# Patient Record
Sex: Female | Born: 1960 | Race: White | Hispanic: No | Marital: Married | Smoking: Never smoker
Health system: Southern US, Community
[De-identification: ages and names within clinical notes are randomized; demographics above are authoritative.]

## PROBLEM LIST (undated history)

## (undated) DIAGNOSIS — F329 Major depressive disorder, single episode, unspecified: Secondary | ICD-10-CM

## (undated) DIAGNOSIS — K1379 Other lesions of oral mucosa: Secondary | ICD-10-CM

## (undated) DIAGNOSIS — F419 Anxiety disorder, unspecified: Secondary | ICD-10-CM

## (undated) DIAGNOSIS — M8580 Other specified disorders of bone density and structure, unspecified site: Secondary | ICD-10-CM

## (undated) DIAGNOSIS — G4733 Obstructive sleep apnea (adult) (pediatric): Secondary | ICD-10-CM

## (undated) DIAGNOSIS — E039 Hypothyroidism, unspecified: Secondary | ICD-10-CM

## (undated) HISTORY — DX: Other lesions of oral mucosa: K13.79

## (undated) HISTORY — DX: Major depressive disorder, single episode, unspecified: F32.9

## (undated) HISTORY — DX: Anxiety disorder, unspecified: F41.9

## (undated) HISTORY — PX: LAPAROSCOPIC TOTAL HYSTERECTOMY: SUR800

## (undated) HISTORY — DX: Hypothyroidism, unspecified: E03.9

## (undated) HISTORY — PX: TONSILLECTOMY: SUR1361

## (undated) HISTORY — PX: APPENDECTOMY: SHX54

## (undated) HISTORY — DX: Other specified disorders of bone density and structure, unspecified site: M85.80

## (undated) HISTORY — DX: Obstructive sleep apnea (adult) (pediatric): G47.33

---

## 2015-03-15 LAB — HM DEXA SCAN: HM Dexa Scan: NORMAL

## 2016-11-16 LAB — HM COLONOSCOPY

## 2017-09-06 DIAGNOSIS — M9901 Segmental and somatic dysfunction of cervical region: Secondary | ICD-10-CM | POA: Diagnosis not present

## 2017-09-06 DIAGNOSIS — M9903 Segmental and somatic dysfunction of lumbar region: Secondary | ICD-10-CM | POA: Diagnosis not present

## 2017-09-06 DIAGNOSIS — M62830 Muscle spasm of back: Secondary | ICD-10-CM | POA: Diagnosis not present

## 2017-09-06 DIAGNOSIS — M9902 Segmental and somatic dysfunction of thoracic region: Secondary | ICD-10-CM | POA: Diagnosis not present

## 2017-09-28 DIAGNOSIS — Z713 Dietary counseling and surveillance: Secondary | ICD-10-CM | POA: Diagnosis not present

## 2017-09-30 DIAGNOSIS — J3089 Other allergic rhinitis: Secondary | ICD-10-CM | POA: Diagnosis not present

## 2017-09-30 DIAGNOSIS — Z780 Asymptomatic menopausal state: Secondary | ICD-10-CM | POA: Diagnosis not present

## 2017-09-30 DIAGNOSIS — E039 Hypothyroidism, unspecified: Secondary | ICD-10-CM | POA: Diagnosis not present

## 2017-12-13 ENCOUNTER — Encounter: Payer: Self-pay | Admitting: Family Medicine

## 2017-12-13 ENCOUNTER — Ambulatory Visit: Payer: BLUE CROSS/BLUE SHIELD | Admitting: Family Medicine

## 2017-12-13 VITALS — BP 122/78 | HR 88 | Temp 98.5°F | Ht 65.0 in | Wt 187.2 lb

## 2017-12-13 DIAGNOSIS — E039 Hypothyroidism, unspecified: Secondary | ICD-10-CM | POA: Diagnosis not present

## 2017-12-13 DIAGNOSIS — Z7689 Persons encountering health services in other specified circumstances: Secondary | ICD-10-CM

## 2017-12-13 DIAGNOSIS — F419 Anxiety disorder, unspecified: Secondary | ICD-10-CM | POA: Insufficient documentation

## 2017-12-13 DIAGNOSIS — R0982 Postnasal drip: Secondary | ICD-10-CM

## 2017-12-13 DIAGNOSIS — G4733 Obstructive sleep apnea (adult) (pediatric): Secondary | ICD-10-CM

## 2017-12-13 HISTORY — DX: Anxiety disorder, unspecified: F41.9

## 2017-12-13 NOTE — Progress Notes (Signed)
Subjective:    Patient ID: Amy Barnett, female    DOB: 07-31-1960, 57 y.o.   MRN: [id number]  HPI Chief Complaint  Patient presents with  . other     establish care   She is new to the practice and here to establish care. She moved here 4 months ago from [facility], prior to that she lived in [place]. States her allergies worsened while living in [place] and since moving here in [place] they have not improved.  She continues having postnasal drainage.  States she was originally on Claritin and 3 months ago switch to [employer].  She has tried Singulair in the past and has seen ENT for this.  Denies ever seen an allergist.  Denies fever, chills, dizziness, chest pain, palpitations, shortness of breath, abdominal pain, N/V/D, urinary symptoms, LE edema.    Reports being on Prozac for anxiety hysterectomy in 2012.  She is stable on this medication and does not want to stop.  No issues with depression or anxiety currently.  Hypothyroidism diagnosed after most recent pregnancy.  She has been on levothyroxine since.  Reports having labs checked in June at her employer and her thyroid function was normal.  These records are not available today.  Reports being stable on current dose.  Reports having a sleep study in [facility] 1 and 1/2 years ago and was told she had severe sleep apnea.  States she could not tolerate the CPAP so she has not been treating it.  Denies fatigue, daytime sleepiness. She does not have these records with her.  We will need to get them.  She is in favor of seeing if a nasal cannula or newer CPAP would work for her.  Reports having a colonoscopy this past year in [place].  We will need to get this report.  States it was normal.  States she has friends and fiance here. Works at [employer].   Reviewed allergies, medications, past medical, surgical, family, and social history.    Review of Systems Pertinent positives and negatives in the history of present  illness.     Objective:   Physical Exam BP 122/78 (BP Location: Left Arm, Patient Position: Sitting)   Pulse 88   Temp 98.5 F (36.9 C)   Ht 5\' 5"  (1.651 m)   Wt 187 lb 3.2 oz (84.9 kg)   SpO2 94%   BMI 31.15 kg/m   Alert and oriented and in no distress. No sinus tenderness. Nares patent with erythema, no discharge.  Tympanic membranes and canals are normal. Pharyngeal area with mild erythema, no edema or exudate. Neck is supple without adenopathy or thyromegaly. Cardiac exam shows a regular sinus rhythm without murmurs or gallops. Lungs are clear to auscultation. Extremities without edema. Skin is warm and dry.       Assessment & Plan:  Hypothyroidism (acquired)  Anxiety  Encounter to establish care  Post-nasal drainage  OSA (obstructive sleep apnea)  Recent labs in June with normal TSH per patient. Continue on levothyroxine. Will recheck thyroid function at her upcoming CPE.  Anxiety- well controlled with Prozac. Stable on medication for years.  Post nasal drainage- continue Allegra. Has used Claritin in the past. Has seen ENT in the past. Singulair did not help. Has never seen an allergist. Discussed this would be our next step.  OSA- "severe" per patient. Will get sleep study done in [facility]. She has not been able to tolerate CPAP and is not currently  treating sleep apnea. Discussed referral to [facility] once I have the report to review and share with [facility].  Discussed avoiding using sedatives in the meantime since this can worsen sleep apnea.  Follow up in 1-2 months for a fasting CPE.

## 2017-12-14 ENCOUNTER — Ambulatory Visit: Payer: Self-pay | Admitting: Family Medicine

## 2017-12-14 DIAGNOSIS — Z713 Dietary counseling and surveillance: Secondary | ICD-10-CM | POA: Diagnosis not present

## 2017-12-27 DIAGNOSIS — Z780 Asymptomatic menopausal state: Secondary | ICD-10-CM | POA: Diagnosis not present

## 2017-12-27 DIAGNOSIS — E039 Hypothyroidism, unspecified: Secondary | ICD-10-CM | POA: Diagnosis not present

## 2017-12-27 DIAGNOSIS — J3089 Other allergic rhinitis: Secondary | ICD-10-CM | POA: Diagnosis not present

## 2018-01-12 DIAGNOSIS — H6590 Unspecified nonsuppurative otitis media, unspecified ear: Secondary | ICD-10-CM | POA: Diagnosis not present

## 2018-02-11 ENCOUNTER — Ambulatory Visit (INDEPENDENT_AMBULATORY_CARE_PROVIDER_SITE_OTHER): Payer: BLUE CROSS/BLUE SHIELD | Admitting: Family Medicine

## 2018-02-11 ENCOUNTER — Encounter: Payer: Self-pay | Admitting: Family Medicine

## 2018-02-11 VITALS — BP 120/78 | HR 81 | Ht 65.0 in | Wt 185.4 lb

## 2018-02-11 DIAGNOSIS — E039 Hypothyroidism, unspecified: Secondary | ICD-10-CM | POA: Diagnosis not present

## 2018-02-11 DIAGNOSIS — Z0000 Encounter for general adult medical examination without abnormal findings: Secondary | ICD-10-CM | POA: Diagnosis not present

## 2018-02-11 DIAGNOSIS — Z1159 Encounter for screening for other viral diseases: Secondary | ICD-10-CM | POA: Diagnosis not present

## 2018-02-11 DIAGNOSIS — F419 Anxiety disorder, unspecified: Secondary | ICD-10-CM | POA: Diagnosis not present

## 2018-02-11 DIAGNOSIS — E2839 Other primary ovarian failure: Secondary | ICD-10-CM | POA: Insufficient documentation

## 2018-02-11 DIAGNOSIS — E559 Vitamin D deficiency, unspecified: Secondary | ICD-10-CM | POA: Diagnosis not present

## 2018-02-11 DIAGNOSIS — G4733 Obstructive sleep apnea (adult) (pediatric): Secondary | ICD-10-CM

## 2018-02-11 DIAGNOSIS — Z1239 Encounter for other screening for malignant neoplasm of breast: Secondary | ICD-10-CM

## 2018-02-11 DIAGNOSIS — Z7185 Encounter for immunization safety counseling: Secondary | ICD-10-CM

## 2018-02-11 DIAGNOSIS — Z90710 Acquired absence of both cervix and uterus: Secondary | ICD-10-CM | POA: Insufficient documentation

## 2018-02-11 DIAGNOSIS — Z23 Encounter for immunization: Secondary | ICD-10-CM | POA: Diagnosis not present

## 2018-02-11 DIAGNOSIS — Z7189 Other specified counseling: Secondary | ICD-10-CM

## 2018-02-11 LAB — POCT URINALYSIS DIP (PROADVANTAGE DEVICE)
BILIRUBIN UA: NEGATIVE
GLUCOSE UA: NEGATIVE mg/dL
Ketones, POC UA: NEGATIVE mg/dL
Leukocytes, UA: NEGATIVE
Nitrite, UA: NEGATIVE
Protein Ur, POC: NEGATIVE mg/dL
RBC UA: NEGATIVE
Specific Gravity, Urine: 1.01
Urobilinogen, Ur: NEGATIVE
pH, UA: 6 (ref 5.0–8.0)

## 2018-02-11 MED ORDER — FLUOXETINE HCL 20 MG PO CAPS: 20.0000 mg | ORAL_CAPSULE | Freq: Every day | ORAL | 5 refills | Status: DC

## 2018-02-11 NOTE — Patient Instructions (Signed)
You can call and schedule a nurse visit to get the Shingrix vaccine. This is a 2 shot series.  You can check with your insurance company to see if this is affordable.   Call and schedule your mammogram and bone density.   We will call you with your lab results.    Preventive Care 40-64 Years, Female Preventive care refers to lifestyle choices and visits with your health care provider that can promote health and wellness. What does preventive care include?   A yearly physical exam. This is also called an annual well check.  Dental exams once or twice a year.  Routine eye exams. Ask your health care provider how often you should have your eyes checked.  Personal lifestyle choices, including: ? Daily care of your teeth and gums. ? Regular physical activity. ? Eating a healthy diet. ? Avoiding tobacco and drug use. ? Limiting alcohol use. ? Practicing safe sex. ? Taking low-dose aspirin daily starting at age 61. ? Taking vitamin and mineral supplements as recommended by your health care provider. What happens during an annual well check? The services and screenings done by your health care provider during your annual well check will depend on your age, overall health, lifestyle risk factors, and family history of disease. Counseling Your health care provider may ask you questions about your:  Alcohol use.  Tobacco use.  Drug use.  Emotional well-being.  Home and relationship well-being.  Sexual activity.  Eating habits.  Work and work Statistician.  Method of birth control.  Menstrual cycle.  Pregnancy history. Screening You may have the following tests or measurements:  Height, weight, and BMI.  Blood pressure.  Lipid and cholesterol levels. These may be checked every 5 years, or more frequently if you are over 27 years old.  Skin check.  Lung cancer screening. You may have this screening every year starting at age 19 if you have a 30-pack-year history of  smoking and currently smoke or have quit within the past 15 years.  Colorectal cancer screening. All adults should have this screening starting at age 2 and continuing until age 6. Your health care provider may recommend screening at age 25. You will have tests every 1-10 years, depending on your results and the type of screening test. People at increased risk should start screening at an earlier age. Screening tests may include: ? Guaiac-based fecal occult blood testing. ? Fecal immunochemical test (FIT). ? Stool DNA test. ? Virtual colonoscopy. ? Sigmoidoscopy. During this test, a flexible tube with a tiny camera (sigmoidoscope) is used to examine your rectum and lower colon. The sigmoidoscope is inserted through your anus into your rectum and lower colon. ? Colonoscopy. During this test, a long, thin, flexible tube with a tiny camera (colonoscope) is used to examine your entire colon and rectum.  Hepatitis C blood test.  Hepatitis B blood test.  Sexually transmitted disease (STD) testing.  Diabetes screening. This is done by checking your blood sugar (glucose) after you have not eaten for a while (fasting). You may have this done every 1-3 years.  Mammogram. This may be done every 1-2 years. Talk to your health care provider about when you should start having regular mammograms. This may depend on whether you have a family history of breast cancer.  BRCA-related cancer screening. This may be done if you have a family history of breast, ovarian, tubal, or peritoneal cancers.  Pelvic exam and Pap test. This may be done every 3 years starting  at age 33. Starting at age 10, this may be done every 5 years if you have a Pap test in combination with an HPV test.  Bone density scan. This is done to screen for osteoporosis. You may have this scan if you are at high risk for osteoporosis. Discuss your test results, treatment options, and if necessary, the need for more tests with your health care  provider. Vaccines Your health care provider may recommend certain vaccines, such as:  Influenza vaccine. This is recommended every year.  Tetanus, diphtheria, and acellular pertussis (Tdap, Td) vaccine. You may need a Td booster every 10 years.  Varicella vaccine. You may need this if you have not been vaccinated.  Zoster vaccine. You may need this after age 5.  Measles, mumps, and rubella (MMR) vaccine. You may need at least one dose of MMR if you were born in 1957 or later. You may also need a second dose.  Pneumococcal 13-valent conjugate (PCV13) vaccine. You may need this if you have certain conditions and were not previously vaccinated.  Pneumococcal polysaccharide (PPSV23) vaccine. You may need one or two doses if you smoke cigarettes or if you have certain conditions.  Meningococcal vaccine. You may need this if you have certain conditions.  Hepatitis A vaccine. You may need this if you have certain conditions or if you travel or work in places where you may be exposed to hepatitis A.  Hepatitis B vaccine. You may need this if you have certain conditions or if you travel or work in places where you may be exposed to hepatitis B.  Haemophilus influenzae type b (Hib) vaccine. You may need this if you have certain conditions. Talk to your health care provider about which screenings and vaccines you need and how often you need them. This information is not intended to replace advice given to you by your health care provider. Make sure you discuss any questions you have with your health care provider. Document Released: 02/15/2015 Document Revised: 03/11/2017 Document Reviewed: 11/20/2014 Elsevier Interactive Patient Education  2019 [employer].

## 2018-02-11 NOTE — Progress Notes (Signed)
Subjective:    Patient ID: Amy BarrPaula Harthen, female    DOB: 11/22/1960, 58 y.o.   MRN: [id number]  HPI Chief Complaint  Patient presents with  . nonfasting cpe    nonfasting cpe, has a lab in work that will do them- need orders. needs refill on med. sees eye doctor. declines flu shot. does not see obgyn   She is fairly new to the practice and here for a complete physical exam. [facility] [facility]  Other providers: Dermatologist- scheduled for March.   Getting married in May  Vitamin D deficiency- taking mulitivitamin   Social history: Lives with fiance. Has 2 kids in [place]. 4 grand kids.  works for [employer] Denies smoking, drinking alcohol, drug use  Diet: just started on weight watchers  Excerise: nothing. Plans to start  Immunizations: refuse flu. Tdap unknown.   Health maintenance:  Mammogram: 2018 DEXA: 4 years ago  Colonoscopy: 2019 and normal  Last Menstrual cycle: hysterectomy  Last Dental Exam: every 6 months  Last Eye Exam: over a year ago. Wears contacts.   Wears seatbelt always, uses sunscreen, smoke detectors in home and functioning, does not text while driving and feels safe in home environment.   Reviewed allergies, medications, past medical, surgical, family, and social history.    Review of Systems Review of Systems Constitutional: -fever, -chills, -sweats, -unexpected weight change,-fatigue ENT: -runny nose, -ear pain, -sore throat Cardiology:  -chest pain, -palpitations, -edema Respiratory: -cough, -shortness of breath, -wheezing Gastroenterology: -abdominal pain, -nausea, -vomiting, -diarrhea, -constipation  Hematology: -bleeding or bruising problems Musculoskeletal: -arthralgias, -myalgias, -joint swelling, -back pain Ophthalmology: -vision changes Urology: -dysuria, -difficulty urinating, -hematuria, -urinary frequency, -urgency Neurology: -headache, -weakness, -tingling, -numbness       Objective:   Physical Exam BP  120/78   Pulse 81   Ht 5\' 5"  (1.651 m)   Wt 185 lb 6.4 oz (84.1 kg)   BMI 30.85 kg/m   General Appearance:    Alert, cooperative, no distress, appears stated age  Head:    Normocephalic, without obvious abnormality, atraumatic  Eyes:    PERRL, conjunctiva/corneas clear, EOM's intact, fundi    benign  Ears:    Normal TM's and external ear canals  Nose:   Nares normal, mucosa normal, no drainage or sinus   tenderness  Throat:   Lips, mucosa, and tongue normal; teeth and gums normal  Neck:   Supple, no lymphadenopathy;  thyroid:  no   enlargement/tenderness/nodules; no carotid   bruit or JVD  Back:    Spine nontender, no curvature, ROM normal, no CVA     tenderness  Lungs:     Clear to auscultation bilaterally without wheezes, rales or     ronchi; respirations unlabored  Chest Wall:    No tenderness or deformity   Heart:    Regular rate and rhythm, S1 and S2 normal, no murmur, rub   or gallop  Breast Exam:    Declines. Mammogram ordered   Abdomen:     Soft, non-tender, nondistended, normoactive bowel sounds,    no masses, no hepatosplenomegaly  Genitalia:    Declines. Hysterectomy      Extremities:   No clubbing, cyanosis or edema  Pulses:   2+ and symmetric all extremities  Skin:   Skin color, texture, turgor normal, no rashes or lesions  Lymph nodes:   Cervical, supraclavicular, and axillary nodes normal  Neurologic:   CNII-XII intact, normal strength, sensation and gait; reflexes 2+ and symmetric  throughout          Psych:   Normal mood, affect, hygiene and grooming.    Urinalysis dipstick: negative       Assessment & Plan:  Routine general medical examination at a health care facility - Plan: CBC with Differential/Platelet, Comprehensive metabolic panel, POCT Urinalysis DIP (Proadvantage Device)  Hypothyroidism (acquired) - Plan: TSH, T4, free  Anxiety  OSA (obstructive sleep apnea)  Screening for breast cancer - Plan: MM DIGITAL SCREENING BILATERAL  Vitamin D  deficiency - Plan: VITAMIN D 25 Hydroxy (Vit-D Deficiency, Fractures)  Estrogen deficiency - Plan: DG Bone Density  H/O total hysterectomy - Plan: DG Bone Density  Need for Tdap vaccination - Plan: Tdap vaccine greater than or equal to 7yo IM  Immunization counseling  Need for hepatitis C screening test - Plan: Hepatitis C antibody  She is still fairly new to me and here today for her CPE.  She is not fasting and requests a prescription to get her lipids checked through her employer on a different day when she is fasting.  A prescription was given to her for this. History of hypothyroidism and reports being stable on her current dose of levothyroxine.  Check thyroid panel and refill as appropriate. Anxiety is well controlled on Prozac.  She will continue on this. History of vitamin D deficiency and is currently taking a multivitamin.  Check vitamin D level She denies having hepatitis C screening in the past.  We will test for hep C today. She will call and schedule her mammogram and bone density test. I am still waiting on medical records from her previous PCP and colonoscopy report from [facility]. Immunization counseling.  She declines flu shot.  Tdap was given and counseling on all components of this vaccine was done.  We also discussed Shingrix vaccine.  She will hold off on this for now but she may call and schedule a nurse visit to get this at her convenience. Once I receive medical records including her sleep study, I will refer her to [facility] to get set up with a CPAP.  Until then she is aware of potential health consequences related to untreated sleep apnea.  We discussed avoiding sedatives and alcohol. Follow-up pending labs

## 2018-02-12 LAB — CBC WITH DIFFERENTIAL/PLATELET
Basophils Absolute: 0.1 10*3/uL (ref 0.0–0.2)
Basos: 1 %
EOS (ABSOLUTE): 0.1 10*3/uL (ref 0.0–0.4)
EOS: 2 %
HEMATOCRIT: 42.8 % (ref 34.0–46.6)
HEMOGLOBIN: 14.2 g/dL (ref 11.1–15.9)
IMMATURE GRANS (ABS): 0 10*3/uL (ref 0.0–0.1)
IMMATURE GRANULOCYTES: 0 %
Lymphocytes Absolute: 2.9 10*3/uL (ref 0.7–3.1)
Lymphs: 37 %
MCH: 30 pg (ref 26.6–33.0)
MCHC: 33.2 g/dL (ref 31.5–35.7)
MCV: 90 fL (ref 79–97)
MONOCYTES: 7 %
Monocytes Absolute: 0.5 10*3/uL (ref 0.1–0.9)
Neutrophils Absolute: 4.2 10*3/uL (ref 1.4–7.0)
Neutrophils: 53 %
PLATELETS: 273 10*3/uL (ref 150–450)
RBC: 4.74 x10E6/uL (ref 3.77–5.28)
RDW: 12.1 % (ref 11.7–15.4)
WBC: 7.8 10*3/uL (ref 3.4–10.8)

## 2018-02-12 LAB — COMPREHENSIVE METABOLIC PANEL
A/G RATIO: 2.2 (ref 1.2–2.2)
ALBUMIN: 5 g/dL (ref 3.5–5.5)
ALT: 23 IU/L (ref 0–32)
AST: 19 IU/L (ref 0–40)
Alkaline Phosphatase: 92 IU/L (ref 39–117)
BUN/Creatinine Ratio: 11 (ref 9–23)
BUN: 9 mg/dL (ref 6–24)
Bilirubin Total: 0.5 mg/dL (ref 0.0–1.2)
CALCIUM: 9.8 mg/dL (ref 8.7–10.2)
CO2: 25 mmol/L (ref 20–29)
Chloride: 98 mmol/L (ref 96–106)
Creatinine, Ser: 0.84 mg/dL (ref 0.57–1.00)
GFR, EST AFRICAN AMERICAN: 90 mL/min/{1.73_m2} (ref >59)
GFR, EST NON AFRICAN AMERICAN: 78 mL/min/{1.73_m2} (ref >59)
GLOBULIN, TOTAL: 2.3 g/dL (ref 1.5–4.5)
Glucose: 102 mg/dL — ABNORMAL HIGH (ref 65–99)
POTASSIUM: 4.4 mmol/L (ref 3.5–5.2)
Sodium: 140 mmol/L (ref 134–144)
TOTAL PROTEIN: 7.3 g/dL (ref 6.0–8.5)

## 2018-02-12 LAB — HEPATITIS C ANTIBODY: Hep C Virus Ab: 0.1 s/co ratio (ref 0.0–0.9)

## 2018-02-12 LAB — VITAMIN D 25 HYDROXY (VIT D DEFICIENCY, FRACTURES): Vit D, 25-Hydroxy: 24.8 ng/mL — ABNORMAL LOW (ref 30.0–100.0)

## 2018-02-12 LAB — TSH: TSH: 2.96 u[IU]/mL (ref 0.450–4.500)

## 2018-02-12 LAB — T4, FREE: FREE T4: 1.46 ng/dL (ref 0.82–1.77)

## 2018-02-15 DIAGNOSIS — Z713 Dietary counseling and surveillance: Secondary | ICD-10-CM | POA: Diagnosis not present

## 2018-02-24 DIAGNOSIS — Z0000 Encounter for general adult medical examination without abnormal findings: Secondary | ICD-10-CM | POA: Diagnosis not present

## 2018-03-01 ENCOUNTER — Telehealth: Payer: Self-pay | Admitting: Internal Medicine

## 2018-03-01 NOTE — Telephone Encounter (Signed)
Pt was notified of cholesterol results

## 2018-03-01 NOTE — Telephone Encounter (Signed)
Left message for pt to call me back    Per vickie, Cholesterol is ok, LDL mildly elevated at 107, like it to be < 100. Eat low fat, low cholesterol diet.

## 2018-03-02 ENCOUNTER — Encounter: Payer: Self-pay | Admitting: Family Medicine

## 2018-03-08 DIAGNOSIS — M546 Pain in thoracic spine: Secondary | ICD-10-CM | POA: Diagnosis not present

## 2018-03-08 DIAGNOSIS — M5412 Radiculopathy, cervical region: Secondary | ICD-10-CM | POA: Diagnosis not present

## 2018-03-08 DIAGNOSIS — M7912 Myalgia of auxiliary muscles, head and neck: Secondary | ICD-10-CM | POA: Diagnosis not present

## 2018-03-08 DIAGNOSIS — M62830 Muscle spasm of back: Secondary | ICD-10-CM | POA: Diagnosis not present

## 2018-03-11 DIAGNOSIS — M7912 Myalgia of auxiliary muscles, head and neck: Secondary | ICD-10-CM | POA: Diagnosis not present

## 2018-03-11 DIAGNOSIS — M62830 Muscle spasm of back: Secondary | ICD-10-CM | POA: Diagnosis not present

## 2018-03-11 DIAGNOSIS — M546 Pain in thoracic spine: Secondary | ICD-10-CM | POA: Diagnosis not present

## 2018-03-11 DIAGNOSIS — M5412 Radiculopathy, cervical region: Secondary | ICD-10-CM | POA: Diagnosis not present

## 2018-03-17 DIAGNOSIS — M7912 Myalgia of auxiliary muscles, head and neck: Secondary | ICD-10-CM | POA: Diagnosis not present

## 2018-03-17 DIAGNOSIS — M5412 Radiculopathy, cervical region: Secondary | ICD-10-CM | POA: Diagnosis not present

## 2018-03-17 DIAGNOSIS — M62830 Muscle spasm of back: Secondary | ICD-10-CM | POA: Diagnosis not present

## 2018-03-17 DIAGNOSIS — M546 Pain in thoracic spine: Secondary | ICD-10-CM | POA: Diagnosis not present

## 2018-03-28 ENCOUNTER — Telehealth: Payer: Self-pay | Admitting: Family Medicine

## 2018-03-28 MED ORDER — LEVOTHYROXINE SODIUM 150 MCG PO TABS: 150.0000 ug | ORAL_TABLET | Freq: Every day | ORAL | 5 refills | Status: DC

## 2018-03-28 NOTE — Telephone Encounter (Signed)
I have sent in med to pharmacy

## 2018-03-28 NOTE — Telephone Encounter (Signed)
Please refill this for her. Labs were normal last month.

## 2018-03-28 NOTE — Telephone Encounter (Signed)
Pt left message she needs levothyroxine to [facility]

## 2018-04-04 ENCOUNTER — Ambulatory Visit
Admission: RE | Admit: 2018-04-04 | Discharge: 2018-04-04 | Disposition: A | Discharge status: Home / Self Care | Payer: BLUE CROSS/BLUE SHIELD | Source: Ambulatory Visit | Attending: Family Medicine | Admitting: Family Medicine

## 2018-04-04 DIAGNOSIS — Z780 Asymptomatic menopausal state: Secondary | ICD-10-CM | POA: Diagnosis not present

## 2018-04-04 DIAGNOSIS — M85852 Other specified disorders of bone density and structure, left thigh: Secondary | ICD-10-CM | POA: Diagnosis not present

## 2018-04-04 DIAGNOSIS — Z1231 Encounter for screening mammogram for malignant neoplasm of breast: Secondary | ICD-10-CM | POA: Diagnosis not present

## 2018-04-04 DIAGNOSIS — E2839 Other primary ovarian failure: Secondary | ICD-10-CM

## 2018-04-04 DIAGNOSIS — Z90710 Acquired absence of both cervix and uterus: Secondary | ICD-10-CM

## 2018-04-04 DIAGNOSIS — Z1239 Encounter for other screening for malignant neoplasm of breast: Secondary | ICD-10-CM

## 2018-04-05 ENCOUNTER — Encounter: Payer: Self-pay | Admitting: Family Medicine

## 2018-04-05 DIAGNOSIS — M8580 Other specified disorders of bone density and structure, unspecified site: Secondary | ICD-10-CM

## 2018-04-05 HISTORY — DX: Other specified disorders of bone density and structure, unspecified site: M85.80

## 2018-04-25 DIAGNOSIS — L578 Other skin changes due to chronic exposure to nonionizing radiation: Secondary | ICD-10-CM | POA: Diagnosis not present

## 2018-04-25 DIAGNOSIS — Z1283 Encounter for screening for malignant neoplasm of skin: Secondary | ICD-10-CM | POA: Diagnosis not present

## 2018-04-25 DIAGNOSIS — D485 Neoplasm of uncertain behavior of skin: Secondary | ICD-10-CM | POA: Diagnosis not present

## 2018-04-25 DIAGNOSIS — L570 Actinic keratosis: Secondary | ICD-10-CM | POA: Diagnosis not present

## 2018-06-03 ENCOUNTER — Telehealth: Payer: Self-pay | Admitting: Family Medicine

## 2018-06-03 NOTE — Telephone Encounter (Signed)
Received requested records from [facility]. Back for review.

## 2018-06-20 ENCOUNTER — Encounter: Payer: Self-pay | Admitting: Family Medicine

## 2018-06-20 DIAGNOSIS — F329 Major depressive disorder, single episode, unspecified: Secondary | ICD-10-CM

## 2018-06-20 HISTORY — DX: Major depressive disorder, single episode, unspecified: F32.9

## 2018-06-22 ENCOUNTER — Encounter: Payer: Self-pay | Admitting: Family Medicine

## 2018-08-18 ENCOUNTER — Other Ambulatory Visit: Payer: Self-pay

## 2018-08-18 ENCOUNTER — Encounter: Payer: Self-pay | Admitting: Medical

## 2018-08-18 ENCOUNTER — Ambulatory Visit: Payer: BC Managed Care – PPO | Admitting: Medical

## 2018-08-18 VITALS — BP 120/82 | HR 83 | Temp 98.6°F | Ht 64.0 in | Wt 187.6 lb

## 2018-08-18 DIAGNOSIS — M7712 Lateral epicondylitis, left elbow: Secondary | ICD-10-CM | POA: Insufficient documentation

## 2018-08-18 DIAGNOSIS — M779 Enthesopathy, unspecified: Secondary | ICD-10-CM

## 2018-08-18 DIAGNOSIS — M778 Other enthesopathies, not elsewhere classified: Secondary | ICD-10-CM

## 2018-08-18 NOTE — Patient Instructions (Addendum)
Your symptoms and exam findings suggests thumb tendonitis and left tennis elbow  Recommendations:  Use REST as much as possible for the next 2 weeks  Use the thumb spice splint daily   Use arm sling over the counter when possible for 1-2 hours at a time  Use a combination of ice therapy for 20 minutes on 20 minutes off alternating with heat therapy 20 minutes on 20 minutes off  You can use Aleve over the counter 2 tablets twice daily for 7-10 days  Talk to your computer IT department about voice activated  software as a possibility to try to limit use of the left hand      De Quervain's Tenosynovitis  De Quervain's tenosynovitis is a condition that causes inflammation of the tendon on the thumb side of the wrist. Tendons are cords of tissue that connect bones to muscles. The tendons in the hand pass through a tunnel called a sheath. A slippery layer of tissue (synovium) lets the tendons move smoothly in the sheath. With de Quervain's tenosynovitis, the sheath swells or thickens, causing friction and pain. The condition is also called de Quervain's disease and de Quervain's syndrome. It occurs most often in women who are 2330-57 years old. What are the causes? The exact cause of this condition is not known. It may be associated with overuse of the hand and wrist. What increases the risk? You are more likely to develop this condition if you:  Use your hands far more than normal, especially if you repeat certain movements that involve twisting your hand or using a tight grip.  Are pregnant.  Are a middle-aged woman.  Have rheumatoid arthritis.  Have diabetes. What are the signs or symptoms? The main symptom of this condition is pain on the thumb side of the wrist. The pain may get worse when you grasp something or turn your wrist. Other symptoms may include:  Pain that extends up the forearm.  Swelling of your wrist and hand.  Trouble moving the thumb and wrist.  A sensation  of snapping in the wrist.  A bump filled with fluid (cyst) in the area of the pain. How is this diagnosed? This condition may be diagnosed based on:  Your symptoms and medical history.  A physical exam. During the exam, your health care provider may do a simple test Lourena Simmonds(Finkelstein test) that involves pulling your thumb and wrist to see if this causes pain. You may also need to have an X-ray. How is this treated? Treatment for this condition may include:  Avoiding any activity that causes pain and swelling.  Taking medicines. Anti-inflammatory medicines and corticosteroid injections may be used to reduce inflammation and relieve pain.  Wearing a splint.  Having surgery. This may be needed if other treatments do not work. Once the pain and swelling has gone down:  Physical therapy. This includes stretching and strengthening exercises.  Occupational therapy. This includes adjusting how you move your wrist. Follow these instructions at home: If you have a splint:  Wear the splint as told by your health care provider. Remove it only as told by your health care provider.  Loosen the splint if your fingers tingle, become numb, or turn cold and blue.  Keep the splint clean.  If the splint is not waterproof: ? Do not let it get wet. ? Cover it with a watertight covering when you take a bath or a shower. Managing pain, stiffness, and swelling   Avoid movements and activities that cause  pain and swelling in the wrist area.  If directed, put ice on the painful area. This may be helpful after doing activities that involve the sore wrist. ? Put ice in a plastic bag. ? Place a towel between your skin and the bag. ? Leave the ice on for 20 minutes, 2-3 times a day.  Move your fingers often to avoid stiffness and to lessen swelling.  Raise (elevate) the injured area above the level of your heart while you are sitting or lying down. General instructions  Return to your normal  activities as told by your health care provider. Ask your health care provider what activities are safe for you.  Take over-the-counter and prescription medicines only as told by your health care provider.  Keep all follow-up visits as told by your health care provider. This is important. Contact a health care provider if:  Your pain medicine does not help.  Your pain gets worse.  You develop new symptoms. Summary  De Quervain's tenosynovitis is a condition that causes inflammation of the tendon on the thumb side of the wrist.  The condition occurs most often in women who are 37-17 years old.  The exact cause of this condition is not known. It may be associated with overuse of the hand and wrist.  Treatment starts with avoiding activity that causes pain or swelling in the wrist area. Other treatment may include wearing a splint and taking medicine. Sometimes, surgery is needed. This information is not intended to replace advice given to you by your health care provider. Make sure you discuss any questions you have with your health care provider. Document Released: 10/14/2000 Document Revised: 07/22/2017 Document Reviewed: 12/28/2016 Elsevier Patient Education  2020 [employer].

## 2018-08-18 NOTE — Progress Notes (Signed)
Subjective: Chief Complaint  Patient presents with  . Hand Pain    left thumb pain and numbness    Here for left thumb pain and numbness that started maybe a month ago.  Wonders about carpal tunnel syndrome.    Been having throbbing for about a month.  Worse this past weekend .  Yesterday couldn't barely adduct the thumb.  Works as a Industrial/product designermortgage closer, on the computer all day.   Is right handed.   No recent injury, no traumas.   She does feel some swelling in the in the meaty part of the thumb and hand.  Sometimes other fingers seem swollen.  She also notes pain including forearm and up to elbow related to the thumb.   No other joint pains.   She is a nonsmoker.  Has had similar problem with right hand 25 years ago.   Just started using a splint she bought OTC.  Using Aleve OTC once daily for past weekend.  Using no ice.  No numbness or tingling, just pain, including sharp pain.  No other aggravating or relieving factors. No other complaint.   Past Medical History:  Diagnosis Date  . Anxiety 12/13/2017  . Hypothyroidism (acquired)   . MDD (major depressive disorder) 06/20/2018  . OSA (obstructive sleep apnea)   . Osteopenia determined by x-ray 04/05/2018  . Uvular swelling    2017 and 2015   Current Outpatient Medications on File Prior to Visit  Medication Sig Dispense Refill  . fexofenadine (ALLEGRA) 180 MG tablet Take 180 mg by mouth daily.    Marland Kitchen. FLUoxetine (PROZAC) 20 MG capsule Take 1 capsule (20 mg total) by mouth daily. 30 capsule 5  . levothyroxine (SYNTHROID, LEVOTHROID) 150 MCG tablet Take 1 tablet (150 mcg total) by mouth daily before breakfast. 30 tablet 5  . acetaminophen (TYLENOL) 500 MG tablet Take 500 mg by mouth every 6 (six) hours as needed.     No current facility-administered medications on file prior to visit.    ROS as in subjective    Objective: BP 120/82   Pulse 83   Temp 98.6 F (37 C) (Oral)   Ht 5\' 4"  (1.626 m)   Wt 187 lb 9.6 oz (85.1 kg)   SpO2 98%   BMI  32.20 kg/m   Gen: wd, wn, nad, white female Skin: warm, no erythema or bruising Quite tender over left thumb base and in between thumb and second finger, pain with some range of motion with abduction and Adduction of thumb both passive and active and resisted, tender mildly in left forearm and over the lateral epicondyle, otherwise mild tenderness in left wrist, rest of left arm and fingers nontender.  There is slight puffiness over the left thumb base and left hand at the base of the thumb.  No other deformity no other swelling.  Range of motion of fingers is normal except for the thumb which is reduced with abduction and adduction Arms neurovascularly intact    Assessment: Encounter Diagnoses  Name Primary?  . Thumb tendonitis Yes  . Lateral epicondylitis of left elbow      Plan: Your symptoms and exam findings suggests thumb tendonitis and left tennis elbow  Recommendations:  Use REST as much as possible for the next 2 weeks  Use the thumb spice splint daily   Use arm sling over the counter when possible for 1-2 hours at a time  Use a combination of ice therapy for 20 minutes on 20 minutes off alternating  with heat therapy 20 minutes on 20 minutes off  You can use Aleve over the counter 2 tablets twice daily for 7-10 days  Talk to your computer IT department about voice activated  software as a possibility to try to limit use of the left hand

## 2018-09-22 ENCOUNTER — Other Ambulatory Visit: Payer: Self-pay | Admitting: Family Medicine

## 2018-11-25 ENCOUNTER — Ambulatory Visit (INDEPENDENT_AMBULATORY_CARE_PROVIDER_SITE_OTHER): Payer: BC Managed Care – PPO | Admitting: Family Medicine

## 2018-11-25 ENCOUNTER — Encounter: Payer: Self-pay | Admitting: Family Medicine

## 2018-11-25 ENCOUNTER — Other Ambulatory Visit: Payer: Self-pay

## 2018-11-25 VITALS — BP 132/76 | HR 77 | Temp 97.8°F | Wt 189.4 lb

## 2018-11-25 DIAGNOSIS — G4733 Obstructive sleep apnea (adult) (pediatric): Secondary | ICD-10-CM

## 2018-11-25 DIAGNOSIS — R0982 Postnasal drip: Secondary | ICD-10-CM

## 2018-11-25 DIAGNOSIS — F419 Anxiety disorder, unspecified: Secondary | ICD-10-CM | POA: Diagnosis not present

## 2018-11-25 DIAGNOSIS — E039 Hypothyroidism, unspecified: Secondary | ICD-10-CM

## 2018-11-25 DIAGNOSIS — E559 Vitamin D deficiency, unspecified: Secondary | ICD-10-CM

## 2018-11-25 DIAGNOSIS — Z13220 Encounter for screening for lipoid disorders: Secondary | ICD-10-CM | POA: Diagnosis not present

## 2018-11-25 DIAGNOSIS — Z8639 Personal history of other endocrine, nutritional and metabolic disease: Secondary | ICD-10-CM

## 2018-11-25 NOTE — Patient Instructions (Signed)
Try taking Mucinex over the counter and increasing water intake to 8 glasses.

## 2018-11-25 NOTE — Progress Notes (Signed)
   Subjective:    Patient ID: Amy Barnett, female    DOB: 08-04-1960, 58 y.o.   MRN: [id number]  HPI Chief Complaint  Patient presents with  . med check    med check, declines flu shot   She is here today for a medication management visit. History of hypothyroidism and has been stable on current dose of levothyroxine for years per patient.  History of vitamin D deficiency and states she has been taking vitamin D 6,000 IUs daily.   Reports a history of elevated triglycerides and no recent lipid panel.  Needs this checked today.  She is fasting.  Anxiety and depression-states she is taking Prozac and has been doing well on this for years.  States she mainly has anxiety issues and does not feel depressed.  Post nasal drainage-she has tried antihistamines without any relief.  This has been an issue for her since moving from [facility].  OSA-reports having a sleep study in [facility] approximately 3 years ago and was told she has severe sleep apnea.  States she could not tolerate the CPAP so she has not been treating her sleep apnea.  States she often wakes up gagging and her husband tells her she snores.  I was unable to get her sleep study report after trying on several occasions.  She is interested in having another sleep study and seeing if she can tolerate a mask now.  No other concerns today.  Never smoker.   Denies fever, chills, dizziness, chest pain, palpitations, shortness of breath, abdominal pain, N/V/D, urinary symptoms, LE edema.     Review of Systems Pertinent positives and negatives in the history of present illness.     Objective:   Physical Exam BP 132/76   Pulse 77   Temp 97.8 F (36.6 C)   Wt 189 lb 6.4 oz (85.9 kg)   BMI 32.51 kg/m   Alert and in no distress. Neck is supple without adenopathy or thyromegaly. Cardiac exam shows a regular sinus rhythm without murmurs or gallops. Lungs are clear to auscultation. Extremities without edema. Skin is warm and dry.       Assessment & Plan:  Hypothyroidism (acquired) - Plan: CBC with Differential/Platelet, Comprehensive metabolic panel, TSH, T4, free -She has been stable on levothyroxine for years.  Check thyroid panel adjust medication as appropriate.  Vitamin D deficiency - Plan: VITAMIN D 25 Hydroxy (Vit-D Deficiency, Fractures) -She seems to be taking a high dose of daily vitamin D.  Her vitamin D level has not been checked recently.  Follow-up pending results  Anxiety - Plan: CBC with Differential/Platelet, Comprehensive metabolic panel, TSH, T4, free -Appears to be doing well on Prozac and no new concerns with her mood.  Post-nasal drainage-she has tried an antihistamine in the past without any success.  Encouraged her to increase her water intake and try Mucinex  OSA (obstructive sleep apnea) - Plan: Home sleep test -She reports a history of severe sleep apnea and this has been untreated.  I recommend that she have a home sleep study and I will order this today.  Discussed potential long-term health consequences associated with untreated sleep apnea.  She is willing to do this and consider wearing CPAP.  History of endogenous hypertriglyceridemia - Plan: Lipid panel -Check lipid panel and follow-up.  Medicate as appropriate

## 2018-11-26 LAB — CBC WITH DIFFERENTIAL/PLATELET
Basophils Absolute: 0 10*3/uL (ref 0.0–0.2)
Basos: 1 %
EOS (ABSOLUTE): 0.1 10*3/uL (ref 0.0–0.4)
Eos: 1 %
Hematocrit: 43.5 % (ref 34.0–46.6)
Hemoglobin: 14.4 g/dL (ref 11.1–15.9)
Immature Grans (Abs): 0 10*3/uL (ref 0.0–0.1)
Immature Granulocytes: 0 %
Lymphocytes Absolute: 2.6 10*3/uL (ref 0.7–3.1)
Lymphs: 34 %
MCH: 29.6 pg (ref 26.6–33.0)
MCHC: 33.1 g/dL (ref 31.5–35.7)
MCV: 89 fL (ref 79–97)
Monocytes Absolute: 0.5 10*3/uL (ref 0.1–0.9)
Monocytes: 7 %
Neutrophils Absolute: 4.5 10*3/uL (ref 1.4–7.0)
Neutrophils: 57 %
Platelets: 281 10*3/uL (ref 150–450)
RBC: 4.87 x10E6/uL (ref 3.77–5.28)
RDW: 12.7 % (ref 11.7–15.4)
WBC: 7.7 10*3/uL (ref 3.4–10.8)

## 2018-11-26 LAB — LIPID PANEL
Chol/HDL Ratio: 3.6 ratio (ref 0.0–4.4)
Cholesterol, Total: 194 mg/dL (ref 100–199)
HDL: 54 mg/dL (ref >39)
LDL Chol Calc (NIH): 105 mg/dL — ABNORMAL HIGH (ref 0–99)
Triglycerides: 206 mg/dL — ABNORMAL HIGH (ref 0–149)
VLDL Cholesterol Cal: 35 mg/dL (ref 5–40)

## 2018-11-26 LAB — COMPREHENSIVE METABOLIC PANEL
ALT: 15 IU/L (ref 0–32)
AST: 18 IU/L (ref 0–40)
Albumin/Globulin Ratio: 1.8 (ref 1.2–2.2)
Albumin: 4.8 g/dL (ref 3.8–4.9)
Alkaline Phosphatase: 96 IU/L (ref 39–117)
BUN/Creatinine Ratio: 15 (ref 9–23)
BUN: 11 mg/dL (ref 6–24)
Bilirubin Total: 0.5 mg/dL (ref 0.0–1.2)
CO2: 25 mmol/L (ref 20–29)
Calcium: 10.2 mg/dL (ref 8.7–10.2)
Chloride: 98 mmol/L (ref 96–106)
Creatinine, Ser: 0.72 mg/dL (ref 0.57–1.00)
GFR calc Af Amer: 108 mL/min/{1.73_m2} (ref >59)
GFR calc non Af Amer: 93 mL/min/{1.73_m2} (ref >59)
Globulin, Total: 2.7 g/dL (ref 1.5–4.5)
Glucose: 87 mg/dL (ref 65–99)
Potassium: 4.5 mmol/L (ref 3.5–5.2)
Sodium: 139 mmol/L (ref 134–144)
Total Protein: 7.5 g/dL (ref 6.0–8.5)

## 2018-11-26 LAB — TSH: TSH: 4.15 u[IU]/mL (ref 0.450–4.500)

## 2018-11-26 LAB — VITAMIN D 25 HYDROXY (VIT D DEFICIENCY, FRACTURES): Vit D, 25-Hydroxy: 82.5 ng/mL (ref 30.0–100.0)

## 2018-11-26 LAB — T4, FREE: Free T4: 1.27 ng/dL (ref 0.82–1.77)

## 2018-11-29 ENCOUNTER — Other Ambulatory Visit: Payer: Self-pay | Admitting: Internal Medicine

## 2018-11-29 MED ORDER — LEVOTHYROXINE SODIUM 150 MCG PO TABS: 150.0000 ug | ORAL_TABLET | Freq: Every day | ORAL | 5 refills | Status: DC

## 2018-12-23 ENCOUNTER — Other Ambulatory Visit: Payer: Self-pay | Admitting: Family Medicine

## 2018-12-23 NOTE — Telephone Encounter (Signed)
Pt informed

## 2018-12-23 NOTE — Telephone Encounter (Signed)
Pt left message saying she needs this refilled

## 2019-01-14 ENCOUNTER — Other Ambulatory Visit: Payer: Self-pay | Admitting: Family Medicine

## 2019-02-20 ENCOUNTER — Ambulatory Visit (HOSPITAL_BASED_OUTPATIENT_CLINIC_OR_DEPARTMENT_OTHER): Payer: BLUE CROSS/BLUE SHIELD | Admitting: Internal Medicine

## 2019-02-27 ENCOUNTER — Ambulatory Visit (HOSPITAL_BASED_OUTPATIENT_CLINIC_OR_DEPARTMENT_OTHER): Payer: Self-pay | Attending: Family Medicine | Admitting: Internal Medicine

## 2019-02-27 ENCOUNTER — Other Ambulatory Visit: Payer: Self-pay

## 2019-02-27 DIAGNOSIS — G4733 Obstructive sleep apnea (adult) (pediatric): Secondary | ICD-10-CM | POA: Insufficient documentation

## 2019-02-27 DIAGNOSIS — R0683 Snoring: Secondary | ICD-10-CM | POA: Insufficient documentation

## 2019-03-03 DIAGNOSIS — Z0279 Encounter for issue of other medical certificate: Secondary | ICD-10-CM

## 2019-03-04 DIAGNOSIS — G4733 Obstructive sleep apnea (adult) (pediatric): Secondary | ICD-10-CM

## 2019-03-04 NOTE — Procedures (Signed)
    Patient Name: Amy Barnett, Amy Barnett Date: 02/27/2019 Gender: Female D.O.B: Jan 08, 1961 Age (years): 58 Referring Provider: Avanell Shackleton NP Height (inches): 64 Interpreting Physician: Jetty Duhamel MD, ABSM Weight (lbs): 180 RPSGT: [place] BMI: 31 MRN: [id number] Neck Size: 14.00  CLINICAL INFORMATION Sleep Study Type: HST Indication for sleep study: OSA Epworth Sleepiness Score: 4  SLEEP STUDY TECHNIQUE A multi-channel overnight portable sleep study was performed. The channels recorded were: nasal airflow, thoracic respiratory movement, and oxygen saturation with a pulse oximetry. Snoring was also monitored.  MEDICATIONS Patient self administered medications include: none reported.  SLEEP ARCHITECTURE Patient was studied for 369.5 minutes. The sleep efficiency was 99.6 % and the patient was supine for 93.7%. The arousal index was 2.8 per hour.  RESPIRATORY PARAMETERS The overall AHI was 78.6 per hour, with a central apnea index of 0.0 per hour.  The oxygen nadir was 71% during sleep.  CARDIAC DATA Mean heart rate during sleep was 76.9 bpm.  IMPRESSIONS - Severe obstructive sleep apnea occurred during this study (AHI = 78.6/h). - No significant central sleep apnea occurred during this study (CAI = 0.0/h). - Oxygen desaturation was noted during this study (Min O2 = 71%). Mean sat 89% - Patient snored.  DIAGNOSIS - Obstructive Sleep Apnea (327.23 [G47.33 ICD-10])  RECOMMENDATIONS - Suggest CPAPtitration sleep study or autopap. Other options would be based on clinical judgment. - Be careful with alcohol, sedatives and other CNS depressants that may worsen sleep apnea and disrupt normal sleep architecture. - Sleep hygiene should be reviewed to assess factors that may improve sleep quality. - Weight management and regular exercise should be initiated or continued.  [Electronically signed] 03/04/2019 12:28 PM  Jetty Duhamel MD, ABSM Diplomate, American  Board of Sleep Medicine   NPI: [id number]                         Jetty Duhamel Diplomate, American Board of Sleep Medicine  ELECTRONICALLY SIGNED ON:  03/04/2019, 12:24 PM [facility] PH: [phone]   FX: [phone] ACCREDITED BY THE AMERICAN ACADEMY OF SLEEP MEDICINE

## 2019-03-06 ENCOUNTER — Other Ambulatory Visit: Payer: Self-pay | Admitting: Internal Medicine

## 2019-03-06 ENCOUNTER — Encounter: Payer: Self-pay | Admitting: Family Medicine

## 2019-03-06 DIAGNOSIS — G4733 Obstructive sleep apnea (adult) (pediatric): Secondary | ICD-10-CM

## 2019-03-24 ENCOUNTER — Encounter: Payer: BC Managed Care – PPO | Admitting: Family Medicine

## 2019-03-24 ENCOUNTER — Ambulatory Visit: Payer: BC Managed Care – PPO | Admitting: Family Medicine

## 2019-03-24 ENCOUNTER — Encounter: Payer: Self-pay | Admitting: Family Medicine

## 2019-03-24 ENCOUNTER — Other Ambulatory Visit: Payer: Self-pay

## 2019-03-24 VITALS — BP 122/82 | HR 81 | Temp 97.5°F | Ht 64.25 in | Wt 187.6 lb

## 2019-03-24 DIAGNOSIS — E039 Hypothyroidism, unspecified: Secondary | ICD-10-CM

## 2019-03-24 DIAGNOSIS — F419 Anxiety disorder, unspecified: Secondary | ICD-10-CM

## 2019-03-24 DIAGNOSIS — E559 Vitamin D deficiency, unspecified: Secondary | ICD-10-CM

## 2019-03-24 DIAGNOSIS — G4733 Obstructive sleep apnea (adult) (pediatric): Secondary | ICD-10-CM | POA: Diagnosis not present

## 2019-03-24 DIAGNOSIS — M8580 Other specified disorders of bone density and structure, unspecified site: Secondary | ICD-10-CM | POA: Diagnosis not present

## 2019-03-24 DIAGNOSIS — Z0000 Encounter for general adult medical examination without abnormal findings: Secondary | ICD-10-CM

## 2019-03-24 DIAGNOSIS — E7800 Pure hypercholesterolemia, unspecified: Secondary | ICD-10-CM

## 2019-03-24 NOTE — Patient Instructions (Addendum)
Try over the counter Xyzal once daily to see if this helps with the sensation in your throat. If not, try Pepcid or Nexium and let me know if none of this helps.    [facility]  [phone] [address] [place]     Preventive Care 58-58 Years Old, Female Preventive care refers to visits with your health care provider and lifestyle choices that can promote health and wellness. This includes:  A yearly physical exam. This may also be called an annual well check.  Regular dental visits and eye exams.  Immunizations.  Screening for certain conditions.  Healthy lifestyle choices, such as eating a healthy diet, getting regular exercise, not using drugs or products that contain nicotine and tobacco, and limiting alcohol use. What can I expect for my preventive care visit? Physical exam Your health care provider will check your:  Height and weight. This may be used to calculate body mass index (BMI), which tells if you are at a healthy weight.  Heart rate and blood pressure.  Skin for abnormal spots. Counseling Your health care provider may ask you questions about your:  Alcohol, tobacco, and drug use.  Emotional well-being.  Home and relationship well-being.  Sexual activity.  Eating habits.  Work and work Statistician.  Method of birth control.  Menstrual cycle.  Pregnancy history. What immunizations do I need?  Influenza (flu) vaccine  This is recommended every year. Tetanus, diphtheria, and pertussis (Tdap) vaccine  You may need a Td booster every 10 years. Varicella (chickenpox) vaccine  You may need this if you have not been vaccinated. Zoster (shingles) vaccine  You may need this after age 92. Measles, mumps, and rubella (MMR) vaccine  You may need at least one dose of MMR if you were born in 1957 or later. You may also need a second dose. Pneumococcal conjugate (PCV13) vaccine  You may need this if you have certain conditions and were not  previously vaccinated. Pneumococcal polysaccharide (PPSV23) vaccine  You may need one or two doses if you smoke cigarettes or if you have certain conditions. Meningococcal conjugate (MenACWY) vaccine  You may need this if you have certain conditions. Hepatitis A vaccine  You may need this if you have certain conditions or if you travel or work in places where you may be exposed to hepatitis A. Hepatitis B vaccine  You may need this if you have certain conditions or if you travel or work in places where you may be exposed to hepatitis B. Haemophilus influenzae type b (Hib) vaccine  You may need this if you have certain conditions. Human papillomavirus (HPV) vaccine  If recommended by your health care provider, you may need three doses over 6 months. You may receive vaccines as individual doses or as more than one vaccine together in one shot (combination vaccines). Talk with your health care provider about the risks and benefits of combination vaccines. What tests do I need? Blood tests  Lipid and cholesterol levels. These may be checked every 5 years, or more frequently if you are over 21 years old.  Hepatitis C test.  Hepatitis B test. Screening  Lung cancer screening. You may have this screening every year starting at age 47 if you have a 30-pack-year history of smoking and currently smoke or have quit within the past 15 years.  Colorectal cancer screening. All adults should have this screening starting at age 2 and continuing until age 29. Your health care provider may recommend screening at age 76 if  you are at increased risk. You will have tests every 1-10 years, depending on your results and the type of screening test.  Diabetes screening. This is done by checking your blood sugar (glucose) after you have not eaten for a while (fasting). You may have this done every 1-3 years.  Mammogram. This may be done every 1-2 years. Talk with your health care provider about when you  should start having regular mammograms. This may depend on whether you have a family history of breast cancer.  BRCA-related cancer screening. This may be done if you have a family history of breast, ovarian, tubal, or peritoneal cancers.  Pelvic exam and Pap test. This may be done every 3 years starting at age 47. Starting at age 75, this may be done every 5 years if you have a Pap test in combination with an HPV test. Other tests  Sexually transmitted disease (STD) testing.  Bone density scan. This is done to screen for osteoporosis. You may have this scan if you are at high risk for osteoporosis. Follow these instructions at home: Eating and drinking  Eat a diet that includes fresh fruits and vegetables, whole grains, lean protein, and low-fat dairy.  Take vitamin and mineral supplements as recommended by your health care provider.  Do not drink alcohol if: ? Your health care provider tells you not to drink. ? You are pregnant, may be pregnant, or are planning to become pregnant.  If you drink alcohol: ? Limit how much you have to 0-1 drink a day. ? Be aware of how much alcohol is in your drink. In the [place] one drink equals one 12 oz bottle of beer (355 mL), one 5 oz glass of wine (148 mL), or one 1 oz glass of hard liquor (44 mL). Lifestyle  Take daily care of your teeth and gums.  Stay active. Exercise for at least 30 minutes on 5 or more days each week.  Do not use any products that contain nicotine or tobacco, such as cigarettes, e-cigarettes, and chewing tobacco. If you need help quitting, ask your health care provider.  If you are sexually active, practice safe sex. Use a condom or other form of birth control (contraception) in order to prevent pregnancy and STIs (sexually transmitted infections).  If told by your health care provider, take low-dose aspirin daily starting at age 12. What's next?  Visit your health care provider once a year for a well check  visit.  Ask your health care provider how often you should have your eyes and teeth checked.  Stay up to date on all vaccines. This information is not intended to replace advice given to you by your health care provider. Make sure you discuss any questions you have with your health care provider. Document Revised: 09/30/2017 Document Reviewed: 09/30/2017 Elsevier Patient Education  2020 [employer].

## 2019-03-24 NOTE — Progress Notes (Signed)
Subjective:    Patient ID: Amy Barnett, female    DOB: [birth year], 59 y.o.   MRN: [id number]  HPI Chief Complaint  Patient presents with  . other    fasting CPE   She is here for a complete physical exam. Previous medical care: Last CPE: 02/11/2018  Other providers: Dermatologist   Several year history of a feeling like something in her throat. States she been evaluated by ENT and had a scope. 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  States no trouble swallowing. Occasional heartburn.   Denies history of allergies. Has tried allergy medications and injection and no improvement.   Vitamin D deficiency- taking a calcium/vitamin D combo  OSA- severe per sleep study. She has not yet received her CPAP but is planning to get this next week. States she is not happy about having to use this but is willing to try.    Social history: Lives with husband, works as Art gallery manager  Denies smoking, drinking alcohol, drug use  Diet: meat and potatoes or rice mostly. Few salads and veggies  Excerise: nothing   Immunizations: Tdap UTD  Health maintenance:  Mammogram: 10/2018 Colonoscopy: in [facility] 2018 and due for recall in 2028 Last Gynecological Exam: total hysterectomy in 2003 for benign issues  Last Menstrual cycle: 2003 DEXA: osteopenia  Last Dental Exam: 3 days ago  Last Eye Exam: 10/2018  Wears seatbelt always, uses sunscreen, smoke detectors in home and functioning, does not text while driving and feels safe in home environment.   Reviewed allergies, medications, past medical, surgical, family, and social history.     Review of Systems Review of Systems Constitutional: -fever, -chills, -sweats, -unexpected weight change,-fatigue ENT: -runny nose, -ear pain, -sore throat Cardiology:  -chest pain, -palpitations, -edema Respiratory: -cough, -shortness of breath, -wheezing Gastroenterology: -abdominal pain, -nausea, -vomiting, -diarrhea, -constipation Hematology:  -bleeding or bruising problems Musculoskeletal: -arthralgias, -myalgias, -joint swelling, -back pain Ophthalmology: -vision changes Urology: -dysuria, -difficulty urinating, -hematuria, -urinary frequency, -urgency Neurology: -headache, -weakness, -tingling, -numbness       Objective:   Physical Exam BP 122/82 (BP Location: Left Arm, Patient Position: Sitting)   Pulse 81   Temp (!) 97.5 F (36.4 C)   Ht 5' 4.25" (1.632 m)   Wt 187 lb 9.6 oz (85.1 kg)   SpO2 98%   BMI 31.95 kg/m   General Appearance:    Alert, cooperative, no distress, appears stated age  Head:    Normocephalic, without obvious abnormality, atraumatic  Eyes:    PERRL, conjunctiva/corneas clear, EOM's intact, fundi    benign  Ears:    Normal TM's and external ear canals  Nose:   Mask   Throat:   Lips, mucosa, and tongue normal; teeth and gums normal  Neck:   Supple, no lymphadenopathy;  thyroid:  no   enlargement/tenderness/nodules; no JVD  Back:    Spine nontender, no curvature, ROM normal, no CVA     tenderness  Lungs:     Clear to auscultation bilaterally without wheezes, rales or     ronchi; respirations unlabored  Chest Wall:    No tenderness or deformity   Heart:    Regular rate and rhythm, S1 and S2 normal, no murmur, rub   or gallop  Breast Exam:    Deferred   Abdomen:     Soft, non-tender, nondistended, normoactive bowel sounds,    no masses, no hepatosplenomegaly  Genitalia:    Deferred      Extremities:  No clubbing, cyanosis or edema  Pulses:   2+ and symmetric all extremities  Skin:   Skin color, texture, turgor normal, no rashes or lesions  Lymph nodes:   Cervical, supraclavicular, and axillary nodes normal  Neurologic:   CNII-XII intact, normal strength, sensation and gait; reflexes 2+ and symmetric throughout          Psych:   Normal mood, affect, hygiene and grooming.        Assessment & Plan:  Routine general medical examination at a health care facility - Plan: CBC with  Differential/Platelet, Comprehensive metabolic panel, TSH -reviewed preventive health, immunizations, safety and healthy lifestyle including diet and exercise.   Severe obstructive sleep apnea -she will contact [facility] about picking up CPAP. Discussed long term health consequences associated with untreated sleep apnea. Discussed how often she stops breathing per hour and that she will most likely have more energy once she starts using this.   Osteopenia determined by x-ray -taking calcium and vitamin D, is not currently exercising so I ncouraged this  Hypothyroidism (acquired) - Plan: TSH -adjust medication as appropriate. Seems to be doing well  Vitamin D deficiency - Plan: VITAMIN D 25 Hydroxy (Vit-D Deficiency, Fractures) -she is taking a supplement   Anxiety -managing well on Prozac  Elevated LDL cholesterol level - Plan: Lipid panel -follow up pending result.

## 2019-03-25 ENCOUNTER — Encounter: Payer: Self-pay | Admitting: Family Medicine

## 2019-03-25 LAB — COMPREHENSIVE METABOLIC PANEL
ALT: 22 IU/L (ref 0–32)
AST: 18 IU/L (ref 0–40)
Albumin/Globulin Ratio: 2 (ref 1.2–2.2)
Albumin: 4.8 g/dL (ref 3.8–4.9)
Alkaline Phosphatase: 92 IU/L (ref 39–117)
BUN/Creatinine Ratio: 14 (ref 9–23)
BUN: 11 mg/dL (ref 6–24)
Bilirubin Total: 0.6 mg/dL (ref 0.0–1.2)
CO2: 25 mmol/L (ref 20–29)
Calcium: 9.9 mg/dL (ref 8.7–10.2)
Chloride: 103 mmol/L (ref 96–106)
Creatinine, Ser: 0.8 mg/dL (ref 0.57–1.00)
GFR calc Af Amer: 95 mL/min/{1.73_m2} (ref >59)
GFR calc non Af Amer: 82 mL/min/{1.73_m2} (ref >59)
Globulin, Total: 2.4 g/dL (ref 1.5–4.5)
Glucose: 85 mg/dL (ref 65–99)
Potassium: 4.8 mmol/L (ref 3.5–5.2)
Sodium: 144 mmol/L (ref 134–144)
Total Protein: 7.2 g/dL (ref 6.0–8.5)

## 2019-03-25 LAB — CBC WITH DIFFERENTIAL/PLATELET
Basophils Absolute: 0 10*3/uL (ref 0.0–0.2)
Basos: 0 %
EOS (ABSOLUTE): 0.2 10*3/uL (ref 0.0–0.4)
Eos: 3 %
Hematocrit: 43.4 % (ref 34.0–46.6)
Hemoglobin: 15 g/dL (ref 11.1–15.9)
Immature Grans (Abs): 0 10*3/uL (ref 0.0–0.1)
Immature Granulocytes: 0 %
Lymphocytes Absolute: 2.6 10*3/uL (ref 0.7–3.1)
Lymphs: 37 %
MCH: 30.5 pg (ref 26.6–33.0)
MCHC: 34.6 g/dL (ref 31.5–35.7)
MCV: 88 fL (ref 79–97)
Monocytes Absolute: 0.4 10*3/uL (ref 0.1–0.9)
Monocytes: 6 %
Neutrophils Absolute: 3.8 10*3/uL (ref 1.4–7.0)
Neutrophils: 54 %
Platelets: 281 10*3/uL (ref 150–450)
RBC: 4.92 x10E6/uL (ref 3.77–5.28)
RDW: 12.5 % (ref 11.7–15.4)
WBC: 7 10*3/uL (ref 3.4–10.8)

## 2019-03-25 LAB — LIPID PANEL
Chol/HDL Ratio: 3.5 ratio (ref 0.0–4.4)
Cholesterol, Total: 194 mg/dL (ref 100–199)
HDL: 55 mg/dL (ref >39)
LDL Chol Calc (NIH): 107 mg/dL — ABNORMAL HIGH (ref 0–99)
Triglycerides: 188 mg/dL — ABNORMAL HIGH (ref 0–149)
VLDL Cholesterol Cal: 32 mg/dL (ref 5–40)

## 2019-03-25 LAB — TSH: TSH: 4.73 u[IU]/mL — ABNORMAL HIGH (ref 0.450–4.500)

## 2019-03-25 LAB — VITAMIN D 25 HYDROXY (VIT D DEFICIENCY, FRACTURES): Vit D, 25-Hydroxy: 43.5 ng/mL (ref 30.0–100.0)

## 2019-04-22 ENCOUNTER — Other Ambulatory Visit: Payer: Self-pay | Admitting: Family Medicine

## 2019-04-24 ENCOUNTER — Encounter (HOSPITAL_BASED_OUTPATIENT_CLINIC_OR_DEPARTMENT_OTHER): Payer: Self-pay | Admitting: Internal Medicine

## 2019-05-30 ENCOUNTER — Other Ambulatory Visit: Payer: Self-pay | Admitting: Family Medicine

## 2019-05-30 DIAGNOSIS — Z1231 Encounter for screening mammogram for malignant neoplasm of breast: Secondary | ICD-10-CM

## 2019-06-13 ENCOUNTER — Ambulatory Visit
Admission: RE | Admit: 2019-06-13 | Discharge: 2019-06-13 | Disposition: A | Discharge status: Home / Self Care | Payer: BC Managed Care – PPO | Source: Ambulatory Visit | Attending: Family Medicine | Admitting: Family Medicine

## 2019-06-13 ENCOUNTER — Other Ambulatory Visit: Payer: Self-pay

## 2019-06-13 DIAGNOSIS — Z1231 Encounter for screening mammogram for malignant neoplasm of breast: Secondary | ICD-10-CM | POA: Diagnosis not present

## 2019-06-20 DIAGNOSIS — G4733 Obstructive sleep apnea (adult) (pediatric): Secondary | ICD-10-CM | POA: Diagnosis not present

## 2019-07-21 DIAGNOSIS — G4733 Obstructive sleep apnea (adult) (pediatric): Secondary | ICD-10-CM | POA: Diagnosis not present

## 2019-07-24 ENCOUNTER — Other Ambulatory Visit: Payer: Self-pay

## 2019-07-24 ENCOUNTER — Encounter: Payer: Self-pay | Admitting: Family Medicine

## 2019-07-24 ENCOUNTER — Ambulatory Visit: Payer: BC Managed Care – PPO | Admitting: Family Medicine

## 2019-07-24 VITALS — BP 120/70 | HR 76 | Wt 191.6 lb

## 2019-07-24 DIAGNOSIS — G4733 Obstructive sleep apnea (adult) (pediatric): Secondary | ICD-10-CM | POA: Diagnosis not present

## 2019-07-24 DIAGNOSIS — R140 Abdominal distension (gaseous): Secondary | ICD-10-CM | POA: Diagnosis not present

## 2019-07-24 DIAGNOSIS — R142 Eructation: Secondary | ICD-10-CM | POA: Diagnosis not present

## 2019-07-24 NOTE — Progress Notes (Signed)
   Subjective:    Patient ID: Amy Barnett, female    DOB: 1961/01/02, 59 y.o.   MRN: [id number]  HPI Chief Complaint  Patient presents with  . follow-up    follow-up on cpap. doing well but feels like she is blown up with air    She is here to follow up on severe OSA and has been using her CPAP for the past 30 days.  Review of her compliance report shows that she has been using her CPAP 27 out of 30 days for 90% compliance.  She has been using it > 4 hours 87% of the time.  States she is waking up several times per night with a lot of air in her stomach and belching.  She feels like the pressure may be too high.  She is using a nasal cannula.  States she cannot tolerate a mask.  States her snoring has improved.  No longer waking up feeling like something is stuck in her throat.   No new concerns or symptoms.  Denies fever, chills, dizziness, chest pain, palpitations, shortness of breath, abdominal pain, N/V/D, urinary symptoms, LE edema.     Review of Systems Pertinent positives and negatives in the history of present illness.     Objective:   Physical Exam BP 120/70   Pulse 76   Wt 191 lb 9.6 oz (86.9 kg)   BMI 32.63 kg/m   Alert and oriented and in no acute distress.  Respirations unlabored.  Normal speech, mood and thought process.      Assessment & Plan:  Severe obstructive sleep apnea  Bloating symptom  Belching  Compliance use reviewed with patient and I congratulated her on being compliant with her nasal cannula.  I feel that she could improve her benefit by having the settings adjusted to see if she is getting an excessive amount of air causing her to have increased belching and bloating.  I asked her to call French [facility] at [facility] and discuss having this adjusted.  Recommend she continue using her CPAP.  She seems to be benefiting

## 2019-08-17 ENCOUNTER — Telehealth: Payer: Self-pay

## 2019-08-17 MED ORDER — LEVOTHYROXINE SODIUM 150 MCG PO TABS: 150.0000 ug | ORAL_TABLET | Freq: Every day | ORAL | 2 refills | Status: DC

## 2019-08-17 NOTE — Telephone Encounter (Signed)
ok 

## 2019-08-17 NOTE — Telephone Encounter (Signed)
Refilled med

## 2019-08-17 NOTE — Telephone Encounter (Signed)
Received fax from [facility] pharmacy for a refill on the pts. Levothyroxine pt. Last apt was 07/24/19 and has no future apts.

## 2019-08-17 NOTE — Telephone Encounter (Signed)
Pt is taking medication daily and doesn't want to come in for Tsh recheck. Back in February when labs were done, she said she missed a couple doses so she started taking med daily. Is this okay to refill?

## 2019-08-20 DIAGNOSIS — G4733 Obstructive sleep apnea (adult) (pediatric): Secondary | ICD-10-CM | POA: Diagnosis not present

## 2019-09-14 ENCOUNTER — Other Ambulatory Visit: Payer: Self-pay | Admitting: Family Medicine

## 2019-09-20 DIAGNOSIS — G4733 Obstructive sleep apnea (adult) (pediatric): Secondary | ICD-10-CM | POA: Diagnosis not present

## 2019-10-21 DIAGNOSIS — G4733 Obstructive sleep apnea (adult) (pediatric): Secondary | ICD-10-CM | POA: Diagnosis not present

## 2019-11-20 DIAGNOSIS — G4733 Obstructive sleep apnea (adult) (pediatric): Secondary | ICD-10-CM | POA: Diagnosis not present

## 2019-12-21 DIAGNOSIS — G4733 Obstructive sleep apnea (adult) (pediatric): Secondary | ICD-10-CM | POA: Diagnosis not present

## 2020-01-20 DIAGNOSIS — G4733 Obstructive sleep apnea (adult) (pediatric): Secondary | ICD-10-CM | POA: Diagnosis not present

## 2020-01-25 DIAGNOSIS — G4733 Obstructive sleep apnea (adult) (pediatric): Secondary | ICD-10-CM | POA: Diagnosis not present

## 2020-01-29 ENCOUNTER — Encounter: Payer: Self-pay | Admitting: Family Medicine

## 2020-01-29 ENCOUNTER — Other Ambulatory Visit: Payer: Self-pay | Admitting: Family Medicine

## 2020-01-29 MED ORDER — LEVOTHYROXINE SODIUM 150 MCG PO TABS: 150.0000 ug | ORAL_TABLET | Freq: Every day | ORAL | 2 refills | Status: DC

## 2020-03-29 ENCOUNTER — Encounter: Payer: Self-pay | Admitting: Family Medicine

## 2020-03-29 ENCOUNTER — Ambulatory Visit: Payer: Managed Care, Other (non HMO) | Admitting: Family Medicine

## 2020-03-29 ENCOUNTER — Other Ambulatory Visit: Payer: Self-pay

## 2020-03-29 ENCOUNTER — Other Ambulatory Visit: Payer: Self-pay | Admitting: Family Medicine

## 2020-03-29 VITALS — BP 120/70 | HR 74 | Ht 64.0 in | Wt 197.0 lb

## 2020-03-29 DIAGNOSIS — M8580 Other specified disorders of bone density and structure, unspecified site: Secondary | ICD-10-CM

## 2020-03-29 DIAGNOSIS — E7800 Pure hypercholesterolemia, unspecified: Secondary | ICD-10-CM

## 2020-03-29 DIAGNOSIS — E039 Hypothyroidism, unspecified: Secondary | ICD-10-CM | POA: Diagnosis not present

## 2020-03-29 DIAGNOSIS — G4733 Obstructive sleep apnea (adult) (pediatric): Secondary | ICD-10-CM

## 2020-03-29 DIAGNOSIS — E559 Vitamin D deficiency, unspecified: Secondary | ICD-10-CM

## 2020-03-29 DIAGNOSIS — Z0000 Encounter for general adult medical examination without abnormal findings: Secondary | ICD-10-CM | POA: Diagnosis not present

## 2020-03-29 DIAGNOSIS — F419 Anxiety disorder, unspecified: Secondary | ICD-10-CM | POA: Diagnosis not present

## 2020-03-29 DIAGNOSIS — Z1231 Encounter for screening mammogram for malignant neoplasm of breast: Secondary | ICD-10-CM

## 2020-03-29 MED ORDER — FLUOXETINE HCL 20 MG PO CAPS: ORAL_CAPSULE | ORAL | 1 refills | Status: DC

## 2020-03-29 NOTE — Patient Instructions (Signed)
Dermatology offices  [facility]: Phone #: [phone] Address: [address] [place], [place] [address]  [facility]: Phone: [phone]  Address: [address], [place], [place] [address]  [facility]: [phone] Address: [address] [place], [place] [address]  [facility] Address: [place], [place], [place] [address] Phone: [phone]   Preventive Care 49-90 Years Old, Female Preventive care refers to lifestyle choices and visits with your health care provider that can promote health and wellness. This includes:  A yearly physical exam. This is also called an annual wellness visit.  Regular dental and eye exams.  Immunizations.  Screening for certain conditions.  Healthy lifestyle choices, such as: ? Eating a healthy diet. ? Getting regular exercise. ? Not using drugs or products that contain nicotine and tobacco. ? Limiting alcohol use. What can I expect for my preventive care visit? Physical exam Your health care provider will check your:  Height and weight. These may be used to calculate your BMI (body mass index). BMI is a measurement that tells if you are at a healthy weight.  Heart rate and blood pressure.  Body temperature.  Skin for abnormal spots. Counseling Your health care provider may ask you questions about your:  Past medical problems.  Family's medical history.  Alcohol, tobacco, and drug use.  Emotional well-being.  Home life and relationship well-being.  Sexual activity.  Diet, exercise, and sleep habits.  Work and work Statistician.  Access to firearms.  Method of birth control.  Menstrual cycle.  Pregnancy history. What immunizations do I need? Vaccines are usually given at various ages, according to a schedule. Your health care provider will recommend vaccines for you based on your age, medical history, and lifestyle or other factors, such as travel or  where you work.   What tests do I need? Blood tests  Lipid and cholesterol levels. These may be checked every 5 years, or more often if you are over 36 years old.  Hepatitis C test.  Hepatitis B test. Screening  Lung cancer screening. You may have this screening every year starting at age 30 if you have a 30-pack-year history of smoking and currently smoke or have quit within the past 15 years.  Colorectal cancer screening. ? All adults should have this screening starting at age 44 and continuing until age 20. ? Your health care provider may recommend screening at age 56 if you are at increased risk. ? You will have tests every 1-10 years, depending on your results and the type of screening test.  Diabetes screening. ? This is done by checking your blood sugar (glucose) after you have not eaten for a while (fasting). ? You may have this done every 1-3 years.  Mammogram. ? This may be done every 1-2 years. ? Talk with your health care provider about when you should start having regular mammograms. This may depend on whether you have a family history of breast cancer.  BRCA-related cancer screening. This may be done if you have a family history of breast, ovarian, tubal, or peritoneal cancers.  Pelvic exam and Pap test. ? This may be done every 3 years starting at age 74. ? Starting at age 71, this may be done every 5 years if you have a Pap test in combination with an HPV test. Other tests  STD (sexually transmitted disease) testing, if you are at risk.  Bone density scan. This is done to screen for osteoporosis. You may have this scan  if you are at high risk for osteoporosis. Talk with your health care provider about your test results, treatment options, and if necessary, the need for more tests. Follow these instructions at home: Eating and drinking  Eat a diet that includes fresh fruits and vegetables, whole grains, lean protein, and low-fat dairy products.  Take vitamin  and mineral supplements as recommended by your health care provider.  Do not drink alcohol if: ? Your health care provider tells you not to drink. ? You are pregnant, may be pregnant, or are planning to become pregnant.  If you drink alcohol: ? Limit how much you have to 0-1 drink a day. ? Be aware of how much alcohol is in your drink. In the [place] one drink equals one 12 oz bottle of beer (355 mL), one 5 oz glass of wine (148 mL), or one 1 oz glass of hard liquor (44 mL).   Lifestyle  Take daily care of your teeth and gums. Brush your teeth every morning and night with fluoride toothpaste. Floss one time each day.  Stay active. Exercise for at least 30 minutes 5 or more days each week.  Do not use any products that contain nicotine or tobacco, such as cigarettes, e-cigarettes, and chewing tobacco. If you need help quitting, ask your health care provider.  Do not use drugs.  If you are sexually active, practice safe sex. Use a condom or other form of protection to prevent STIs (sexually transmitted infections).  If you do not wish to become pregnant, use a form of birth control. If you plan to become pregnant, see your health care provider for a prepregnancy visit.  If told by your health care provider, take low-dose aspirin daily starting at age 52.  Find healthy ways to cope with stress, such as: ? Meditation, yoga, or listening to music. ? Journaling. ? Talking to a trusted person. ? Spending time with friends and family. Safety  Always wear your seat belt while driving or riding in a vehicle.  Do not drive: ? If you have been drinking alcohol. Do not ride with someone who has been drinking. ? When you are tired or distracted. ? While texting.  Wear a helmet and other protective equipment during sports activities.  If you have firearms in your house, make sure you follow all gun safety procedures. What's next?  Visit your health care provider once a year for an annual  wellness visit.  Ask your health care provider how often you should have your eyes and teeth checked.  Stay up to date on all vaccines. This information is not intended to replace advice given to you by your health care provider. Make sure you discuss any questions you have with your health care provider. Document Revised: 10/24/2019 Document Reviewed: 09/30/2017 Elsevier Patient Education  2021 [employer].

## 2020-03-29 NOTE — Progress Notes (Signed)
Subjective:    Patient ID: Amy Barnett, female    DOB: 1960-07-24, 60 y.o.   MRN: [id number]  HPI Chief Complaint  Patient presents with  . fasting cpe    Fasting cpe, declines pelvic exam today. No concerns. Declines flu shot   She is new to the practice and here for a complete physical exam and follow up on chronic health conditions.  Other providers: Dermatologist   Anxiety- her mood is good. Taking Prozac daily   Hypothyroidism- taking levothyroxine daily on empty stomach   OSA on CPAP   Taking vitamin D daily for vitamin D def  Intermittent left thumb pain. Not often. Not bothersome. Does not want a work up  Osteopenia- taking Os Cal and collagen OTC Weight bearing exercises. Using a Wii fit Walks her dog  Social history: Lives with husband and her mother, works from home for [employer]  Denies smoking or drug use  Alcohol use once weekly  Diet: fairly healthy per patient. Cooks at home.    Immunizations: declines flu and Covid vaccines   Health maintenance:  Mammogram: May 2021  Colonoscopy: 2017 in [place] and due for recall in 2027 per patient.  Last Gynecological Exam:  Years ago. Declines today  DEXA- 04/2018 Last Menstrual cycle: hyst Dental Exams: every 6 months  Last Eye Exam: January 2022  Wears seatbelt always, uses sunscreen, smoke detectors in home and functioning, does not text while driving and feels safe in home environment.   Reviewed allergies, medications, past medical, surgical, family, and social history.     Review of Systems Review of Systems Constitutional: -fever, -chills, -sweats, -unexpected weight change,-fatigue ENT: -runny nose, -ear pain, -sore throat Cardiology:  -chest pain, -palpitations, -edema Respiratory: -cough, -shortness of breath, -wheezing Gastroenterology: -abdominal pain, -nausea, -vomiting, -diarrhea, -constipation  Hematology: -bleeding or bruising problems Musculoskeletal: -arthralgias,  -myalgias, -joint swelling, -back pain Ophthalmology: -vision changes Urology: -dysuria, -difficulty urinating, -hematuria, -urinary frequency, -urgency Neurology: -headache, -weakness, -tingling, -numbness       Objective:   Physical Exam BP 120/70   Pulse 74   Ht 5\' 4"  (1.626 m)   Wt 197 lb (89.4 kg)   BMI 33.81 kg/m   General Appearance:    Alert, cooperative, no distress, appears stated age  Head:    Normocephalic, without obvious abnormality, atraumatic  Eyes:    PERRL, conjunctiva/corneas clear, EOM's intact, fundi    benign  Ears:    Normal TM's and external ear canals  Nose:   Mask on   Throat:   Mask on   Neck:   Supple, no lymphadenopathy;  thyroid:  no   enlargement/tenderness/nodules; no JVD  Back:    Spine nontender, no curvature, ROM normal, no CVA     tenderness  Lungs:     Clear to auscultation bilaterally without wheezes, rales or     ronchi; respirations unlabored  Chest Wall:    No tenderness or deformity   Heart:    Regular rate and rhythm, S1 and S2 normal, no murmur, rub   or gallop  Breast Exam:   declines      Abdomen:     Soft, non-tender, nondistended, normoactive bowel sounds,    no masses, no hepatosplenomegaly  Genitalia:    Declines      Extremities:   No clubbing, cyanosis or edema  Pulses:   2+ and symmetric all extremities  Skin:   Skin color, texture, turgor normal, no rashes or lesions  Lymph  nodes:   Cervical, supraclavicular, and axillary nodes normal  Neurologic:   CNII-XII intact, normal strength, sensation and gait          Psych:   Normal mood, affect, hygiene and grooming.          Assessment & Plan:  Routine general medical examination at a health care facility - Plan: CBC with Differential/Platelet, Comprehensive metabolic panel, TSH, T4, free, Lipid panel -Preventive health care reviewed.  Mammogram is up-to-date.  Per patient, colonoscopy is up-to-date but this was last done in [facility] and we do not have the report.   Declines pelvic exam.  Hysterectomy in the past for benign issues.  Recommend she call and schedule with a dermatologist for regular skin checks. Recommend regular dental and eye exams and she is up-to-date. Counseling on healthy lifestyle including diet and exercise. Her mood is good Declines immunizations including Covid and flu vaccines. Discussed safety and health promotion.  Severe obstructive sleep apnea -She is using her CPAP nightly and doing fine with it.  She can tell a difference overall.  Osteopenia determined by x-ray - Plan: DG Bone Density -She is getting adequate calcium and taking a vitamin D supplement.  Doing weightbearing exercises such as walking.  She will be due for repeat DEXA this year.  She will schedule it with her mammogram in May.  Hypothyroidism (acquired) - Plan: TSH, T4, free -Check thyroid function and adjust dose of levothyroxine as appropriate.  She is taking it with water on empty stomach.  Anxiety -Doing well on Prozac, I will refill this.  Elevated LDL cholesterol level - Plan: Lipid panel -Recommend low-fat diet and getting adequate physical activity.  Check lipid panel and follow-up  Vitamin D deficiency - Plan: VITAMIN D 25 Hydroxy (Vit-D Deficiency, Fractures) -Continue supplemental vitamin D.  Adjust dose pending vitamin D level

## 2020-03-30 ENCOUNTER — Other Ambulatory Visit: Payer: Self-pay | Admitting: Family Medicine

## 2020-03-30 DIAGNOSIS — E039 Hypothyroidism, unspecified: Secondary | ICD-10-CM

## 2020-03-30 LAB — CBC WITH DIFFERENTIAL/PLATELET
Basophils Absolute: 0.1 10*3/uL (ref 0.0–0.2)
Basos: 1 %
EOS (ABSOLUTE): 0.1 10*3/uL (ref 0.0–0.4)
Eos: 2 %
Hematocrit: 42 % (ref 34.0–46.6)
Hemoglobin: 14.1 g/dL (ref 11.1–15.9)
Immature Grans (Abs): 0 10*3/uL (ref 0.0–0.1)
Immature Granulocytes: 0 %
Lymphocytes Absolute: 2.2 10*3/uL (ref 0.7–3.1)
Lymphs: 31 %
MCH: 30.2 pg (ref 26.6–33.0)
MCHC: 33.6 g/dL (ref 31.5–35.7)
MCV: 90 fL (ref 79–97)
Monocytes Absolute: 0.5 10*3/uL (ref 0.1–0.9)
Monocytes: 7 %
Neutrophils Absolute: 4.1 10*3/uL (ref 1.4–7.0)
Neutrophils: 59 %
Platelets: 245 10*3/uL (ref 150–450)
RBC: 4.67 x10E6/uL (ref 3.77–5.28)
RDW: 12.4 % (ref 11.7–15.4)
WBC: 6.9 10*3/uL (ref 3.4–10.8)

## 2020-03-30 LAB — COMPREHENSIVE METABOLIC PANEL
ALT: 22 IU/L (ref 0–32)
AST: 21 IU/L (ref 0–40)
Albumin/Globulin Ratio: 1.9 (ref 1.2–2.2)
Albumin: 4.7 g/dL (ref 3.8–4.9)
Alkaline Phosphatase: 99 IU/L (ref 44–121)
BUN/Creatinine Ratio: 15 (ref 9–23)
BUN: 12 mg/dL (ref 6–24)
Bilirubin Total: 0.5 mg/dL (ref 0.0–1.2)
CO2: 25 mmol/L (ref 20–29)
Calcium: 9.6 mg/dL (ref 8.7–10.2)
Chloride: 102 mmol/L (ref 96–106)
Creatinine, Ser: 0.78 mg/dL (ref 0.57–1.00)
GFR calc Af Amer: 97 mL/min/{1.73_m2} (ref >59)
GFR calc non Af Amer: 84 mL/min/{1.73_m2} (ref >59)
Globulin, Total: 2.5 g/dL (ref 1.5–4.5)
Glucose: 98 mg/dL (ref 65–99)
Potassium: 4.7 mmol/L (ref 3.5–5.2)
Sodium: 142 mmol/L (ref 134–144)
Total Protein: 7.2 g/dL (ref 6.0–8.5)

## 2020-03-30 LAB — LIPID PANEL
Chol/HDL Ratio: 3.6 ratio (ref 0.0–4.4)
Cholesterol, Total: 185 mg/dL (ref 100–199)
HDL: 52 mg/dL (ref >39)
LDL Chol Calc (NIH): 104 mg/dL — ABNORMAL HIGH (ref 0–99)
Triglycerides: 170 mg/dL — ABNORMAL HIGH (ref 0–149)
VLDL Cholesterol Cal: 29 mg/dL (ref 5–40)

## 2020-03-30 LAB — TSH: TSH: 3.44 u[IU]/mL (ref 0.450–4.500)

## 2020-03-30 LAB — VITAMIN D 25 HYDROXY (VIT D DEFICIENCY, FRACTURES): Vit D, 25-Hydroxy: 57.1 ng/mL (ref 30.0–100.0)

## 2020-03-30 LAB — T4, FREE: Free T4: 1.32 ng/dL (ref 0.82–1.77)

## 2020-03-30 MED ORDER — LEVOTHYROXINE SODIUM 150 MCG PO TABS: 150.0000 ug | ORAL_TABLET | Freq: Every day | ORAL | 5 refills | Status: DC

## 2020-05-10 ENCOUNTER — Encounter: Payer: Self-pay | Admitting: Internal Medicine

## 2020-06-19 ENCOUNTER — Encounter: Payer: Self-pay | Admitting: Family Medicine

## 2020-06-19 DIAGNOSIS — E039 Hypothyroidism, unspecified: Secondary | ICD-10-CM

## 2020-06-19 MED ORDER — LEVOTHYROXINE SODIUM 150 MCG PO TABS: 150.0000 ug | ORAL_TABLET | Freq: Every day | ORAL | 5 refills | Status: DC

## 2020-07-31 ENCOUNTER — Encounter: Payer: Self-pay | Admitting: Internal Medicine

## 2020-09-06 ENCOUNTER — Ambulatory Visit
Admission: RE | Admit: 2020-09-06 | Discharge: 2020-09-06 | Disposition: A | Discharge status: Home / Self Care | Payer: BC Managed Care – PPO | Source: Ambulatory Visit | Attending: Family Medicine | Admitting: Family Medicine

## 2020-09-06 ENCOUNTER — Ambulatory Visit
Admission: RE | Admit: 2020-09-06 | Discharge: 2020-09-06 | Disposition: A | Discharge status: Home / Self Care | Payer: Managed Care, Other (non HMO) | Source: Ambulatory Visit | Attending: Family Medicine | Admitting: Family Medicine

## 2020-09-06 ENCOUNTER — Other Ambulatory Visit: Payer: Self-pay

## 2020-09-06 DIAGNOSIS — M8580 Other specified disorders of bone density and structure, unspecified site: Secondary | ICD-10-CM

## 2020-09-06 DIAGNOSIS — Z1231 Encounter for screening mammogram for malignant neoplasm of breast: Secondary | ICD-10-CM

## 2020-09-26 ENCOUNTER — Encounter: Payer: Self-pay | Admitting: Family Medicine

## 2020-09-30 ENCOUNTER — Telehealth: Payer: Self-pay | Admitting: Family Medicine

## 2020-09-30 NOTE — Telephone Encounter (Signed)
Pt called and is requesting a refill for her prozac pt made a cpe for 03/31/21 Please send to the [facility] [address] [place], [place] - [address]

## 2020-09-30 NOTE — Telephone Encounter (Signed)
Pt states she does not have anything to talk about and that she is perfectly fine and does not want to come in. She does have something scheduled for February 2023. Ok to refill med longer and just wait until her cpe

## 2020-10-01 NOTE — Telephone Encounter (Signed)
Pt did not want to schedule as she had no reason to come in. I did not refill medication

## 2020-10-31 ENCOUNTER — Telehealth: Payer: Managed Care, Other (non HMO) | Admitting: Family Medicine

## 2020-10-31 ENCOUNTER — Encounter: Payer: Self-pay | Admitting: Family Medicine

## 2020-10-31 ENCOUNTER — Other Ambulatory Visit: Payer: Self-pay

## 2020-10-31 VITALS — BP 127/71 | HR 81 | Wt 185.0 lb

## 2020-10-31 DIAGNOSIS — E039 Hypothyroidism, unspecified: Secondary | ICD-10-CM | POA: Diagnosis not present

## 2020-10-31 DIAGNOSIS — F419 Anxiety disorder, unspecified: Secondary | ICD-10-CM

## 2020-10-31 DIAGNOSIS — U071 COVID-19: Secondary | ICD-10-CM | POA: Diagnosis not present

## 2020-10-31 DIAGNOSIS — Z289 Immunization not carried out for unspecified reason: Secondary | ICD-10-CM

## 2020-10-31 MED ORDER — FLUOXETINE HCL 20 MG PO CAPS: ORAL_CAPSULE | ORAL | 1 refills | Status: AC

## 2020-10-31 MED ORDER — NIRMATRELVIR/RITONAVIR (PAXLOVID)TABLET: 3.0000 | ORAL_TABLET | Freq: Two times a day (BID) | ORAL | 0 refills | Status: AC

## 2020-10-31 MED ORDER — LEVOTHYROXINE SODIUM 150 MCG PO TABS: 150.0000 ug | ORAL_TABLET | Freq: Every day | ORAL | 5 refills | Status: AC

## 2020-10-31 NOTE — Progress Notes (Signed)
   Subjective:  Documentation for virtual audio and video telecommunications through Caregility encounter:  The patient was located at home. 2 patient identifiers used.  The provider was located at home. The patient did consent to this visit and is aware of possible charges through their insurance for this visit.  The other persons participating in this telemedicine service were none. Time spent on call was 15 minutes and in review of previous records >20 minutes total.  This virtual service is not related to other E/M service within previous 7 days.   Patient ID: Amy Barnett, female    DOB: 02-May-1960, 60 y.o.   MRN: [id number]  HPI Chief Complaint  Patient presents with   Covid Positive    Home test positive today   Cough    Fatigue, ear fullness, Chest Congestion started 2 days ago, denies Fever   Complains of a 3 day history of fatigue, ears feeling full, sore throat, chest congestion and cough.  Recently returned from a trip to [place]. States she tested positive for COVID today.  She is not vaccinated against COVID.  She has underlying hypothyroidism , obesity and OSA.  No fever, chills, dizziness, chest pain, palpitations, shortness of breath, abdominal pain, nausea, vomiting or diarrhea.  Requests refills of Prozac and Synthroid.  Reviewed allergies, medications, past medical, surgical, family, and social history.     Review of Systems Pertinent positives and negatives in the history of present illness.     Objective:   Physical Exam BP 127/71 Comment: Home reading  Pulse 81   Wt 185 lb (83.9 kg)   BMI 31.76 kg/m   Alert and oriented and in no acute distress.  Respirations unlabored.  Speaking in complete sentences without difficulty.  Normal speech, mood and thought process.      Assessment & Plan:  COVID-19 virus infection - Plan: nirmatrelvir/ritonavir EUA (PAXLOVID) 20 x 150 MG & 10 x 100MG  TABS  Anxiety - Plan: FLUoxetine (PROZAC) 20 MG  capsule  Hypothyroidism (acquired) - Plan: levothyroxine (SYNTHROID) 150 MCG tablet  COVID-19 vaccination not done - Plan: nirmatrelvir/ritonavir EUA (PAXLOVID) 20 x 150 MG & 10 x 100MG  TABS  Day 3 of COVID infection.  Discussed whether or not she would like to take PAXLOVID and she is amenable.  Discussed how to take the medication, possible side effects as well as counseling on symptomatic management.  Encourage good hydration, vitamins and rest.  We discussed quarantine guidelines. She will follow-up if significantly worsening.  Refilled medication for anxiety and hypothyroidism.

## 2020-12-25 MED ORDER — SYNTHROID 150 MCG PO TABS: 150.0000 ug | ORAL_TABLET | Freq: Every day | ORAL | 2 refills | Status: DC

## 2021-01-03 ENCOUNTER — Telehealth: Payer: Self-pay | Admitting: Family Medicine

## 2021-01-03 NOTE — Telephone Encounter (Signed)
Pt called re her levothyroxine rx, she said that her rx was sent in for brand name synthroid and she always takes The generic levothyroxine . Spoke to [employer] and she has active rx from [facility] for levothyroxine that has Active refills. He will get that rx ready for her and he is cancelling Synthroid rx

## 2021-01-12 NOTE — Progress Notes (Signed)
Chief Complaint  Patient presents with   Cough    Has had a dry cough since end of Sept. She went to [facility] and was given amoxil and prednisone-never took the cough away. Coughs in the most inconvenient places and times.     10/27 seen in UC with complaint of 3 weeks of symptoms--cough (hacking, deep cough), head congestion.  Her husband had COVID in September, but she apparently never got it. (Our records show +home test on 9/29 with virtual visit with Vickie, rx'd Paxlovid. Pt states that her test and her husband's got mixed up, so they both repeated the tests--hers was negative, his was +; she never took the Paxlovid).  In the UC, she was treated with Augmentin and prednisone. She no longer has congestion.  She still has a dry cough.  She gets a tickle in the left side of her throat.  She has chronic sinus drainage--occasionally coughs up thick mucus, white.  Denies any wheezing or shortness of breath.  She hasn't found anything that helps sinus drainage. Hasn't taken any allergy medications in quite a while (antihistamines or nasal sprays). She has tried Delsym, helps a little. She chews a lot of gum, which also helps with the tickle.  Occasional heartburn, not daily.  PMH, PSH, SH reviewed  Hypothyroidism, OSA on CPAP Lab Results  Component Value Date   TSH 3.440 03/29/2020   Outpatient Encounter Medications as of 01/13/2021  Medication Sig Note   cholecalciferol (VITAMIN D3) 25 MCG (1000 UNIT) tablet Take 1,000 Units by mouth daily.    FLUoxetine (PROZAC) 20 MG capsule TAKE 1 CAPSULE BY MOUTH EVERY DAY    levothyroxine (SYNTHROID) 150 MCG tablet Take 1 tablet (150 mcg total) by mouth daily before breakfast.    acetaminophen (TYLENOL) 500 MG tablet Take 500 mg by mouth every 6 (six) hours as needed. (Patient not taking: Reported on 01/13/2021) 01/13/2021: Only takes as needed   [DISCONTINUED] amoxicillin-clavulanate (AUGMENTIN) 875-125 MG tablet Take 1 tablet by mouth 2 (two) times  daily. (Patient not taking: Reported on 01/13/2021)    [DISCONTINUED] predniSONE (DELTASONE) 20 MG tablet Take 40 mg by mouth daily. (Patient not taking: Reported on 01/13/2021)    [DISCONTINUED] SYNTHROID 150 MCG tablet Take 1 tablet (150 mcg total) by mouth daily before breakfast.    No facility-administered encounter medications on file as of 01/13/2021.   No Known Allergies  ROS: no fever, chills, sinus pain, ear pain, chest pain, shortness of breath, nausea, vomiting, diarrhea, rashes.  Occasional indigestion.  See HPI   PHYSICAL EXAM:  BP 130/80   Pulse 80   Temp 98.4 F (36.9 C) (Tympanic)   Ht 5\' 5"  (1.651 m)   Wt 187 lb 6.4 oz (85 kg)   BMI 31.18 kg/m   Well-appearing, pleasant female in no distress. Only very rare cough noted during visit. She was speaking comfortably, in no distress HEENT: conjunctiva and sclera are clear, EOMI Nose: clear, stringly mucus noted on the R, mild edema of L nares, no drainage.  Sinuses are nontender. OP is clear.  TM's and EAC's normal Neck: No lymphadenopathy, thyromegaly or mass Heart: regular rate and rhythm Lungs: clear bilaterally, good air movement, no wheezes, rales, ronchi Extremities: no edema Psych: normal mood, affect, hygiene and grooming Neuro: alert and oriented, cranial nerves grossly intact, normal gait  ASSESSMENT/PLAN:  Subacute cough - suspect related to PND/allergies. No e/o infection. Will check CXR to r/o other etiologies - Plan: DG Chest 2 View  Allergic  rhinitis, unspecified seasonality, unspecified trigger - discussed nasal steroids, antihistamines, and mucinex.  to f/u if any fever purulent drainage or other changes/concerns   Scheduled for CPE with Burnard Hawthorne for 03/2021.

## 2021-01-13 ENCOUNTER — Other Ambulatory Visit: Payer: Self-pay

## 2021-01-13 ENCOUNTER — Encounter: Payer: Self-pay | Admitting: Family Medicine

## 2021-01-13 ENCOUNTER — Ambulatory Visit: Payer: Managed Care, Other (non HMO) | Admitting: Family Medicine

## 2021-01-13 ENCOUNTER — Ambulatory Visit
Admission: RE | Admit: 2021-01-13 | Discharge: 2021-01-13 | Disposition: A | Discharge status: Home / Self Care | Payer: Managed Care, Other (non HMO) | Source: Ambulatory Visit | Attending: Family Medicine | Admitting: Family Medicine

## 2021-01-13 VITALS — BP 130/80 | HR 80 | Temp 98.4°F | Ht 65.0 in | Wt 187.4 lb

## 2021-01-13 DIAGNOSIS — J309 Allergic rhinitis, unspecified: Secondary | ICD-10-CM | POA: Diagnosis not present

## 2021-01-13 DIAGNOSIS — R052 Subacute cough: Secondary | ICD-10-CM | POA: Diagnosis not present

## 2021-01-13 NOTE — Patient Instructions (Addendum)
Go to [facility] at either [address] to get your chest x-ray.  Allergies and reflux can cause chronic dry cough. Let's start with allergy treatment.  If this doesn't help, you can add in a trial of prilosec OTC for 2 weeks.  For allergies--I recommend taking an antihistamine such as claritin or allegra or zyrtec, once daily.  You may try that alone, or also start a nasal steroid such as Flonase. The flonase can take over a week to have full effect, so should be used along with the antihistamine for at least a week. Once symptoms improve, you can try cutting back on the pills, and continue the nasal spray, or vice versa. Flonase--2 gentle sniffs into each nostril once daily. Once you are better, you can eventually try cutting back to just 1 spray into each nostril. Some people only need this seasonally, other people need it year-round.  Instead of Delsym, try Mucinex-DM 12 hour tablets and take it twice daily. This has the expectorant guaifenesin (to loosen any thick mucus), and also has the same ingredient as Delsym, but combined in the pill form.

## 2021-03-11 ENCOUNTER — Encounter: Payer: Self-pay | Admitting: Family Medicine

## 2021-03-31 ENCOUNTER — Encounter: Payer: Managed Care, Other (non HMO) | Admitting: Physician Assistant

## 2021-03-31 ENCOUNTER — Encounter: Payer: Managed Care, Other (non HMO) | Admitting: Family Medicine

## 2021-08-01 ENCOUNTER — Encounter: Payer: Self-pay | Admitting: Internal Medicine

## 2021-09-04 IMAGING — MG DIGITAL SCREENING BILAT W/ CAD
5 series · 5 of 5 positions shown · non-contrast
Comparison: Previous exam(s).

CLINICAL DATA: Screening.

EXAM:
DIGITAL SCREENING BILATERAL MAMMOGRAM WITH CAD
TECHNIQUE: Bilateral screening digital craniocaudal and mediolateral oblique
mammograms were obtained. The images were evaluated with
computer-aided detection.

[R CC (1 of 2)]
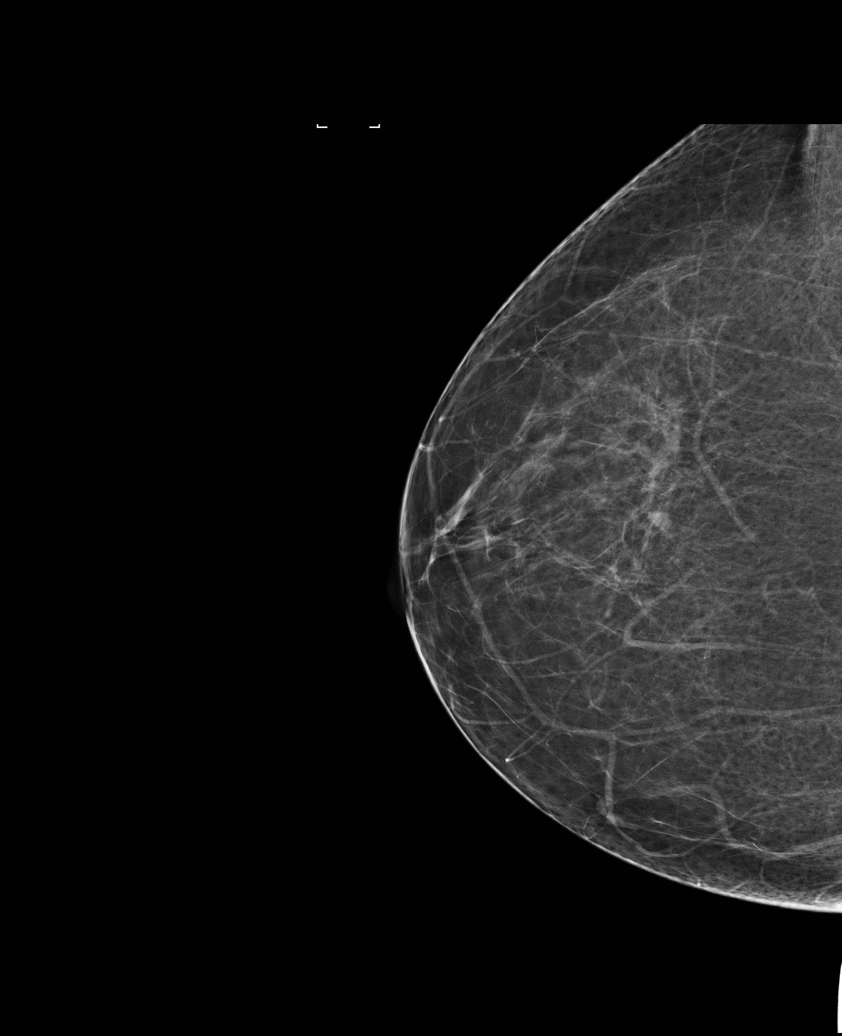

[L MLO]
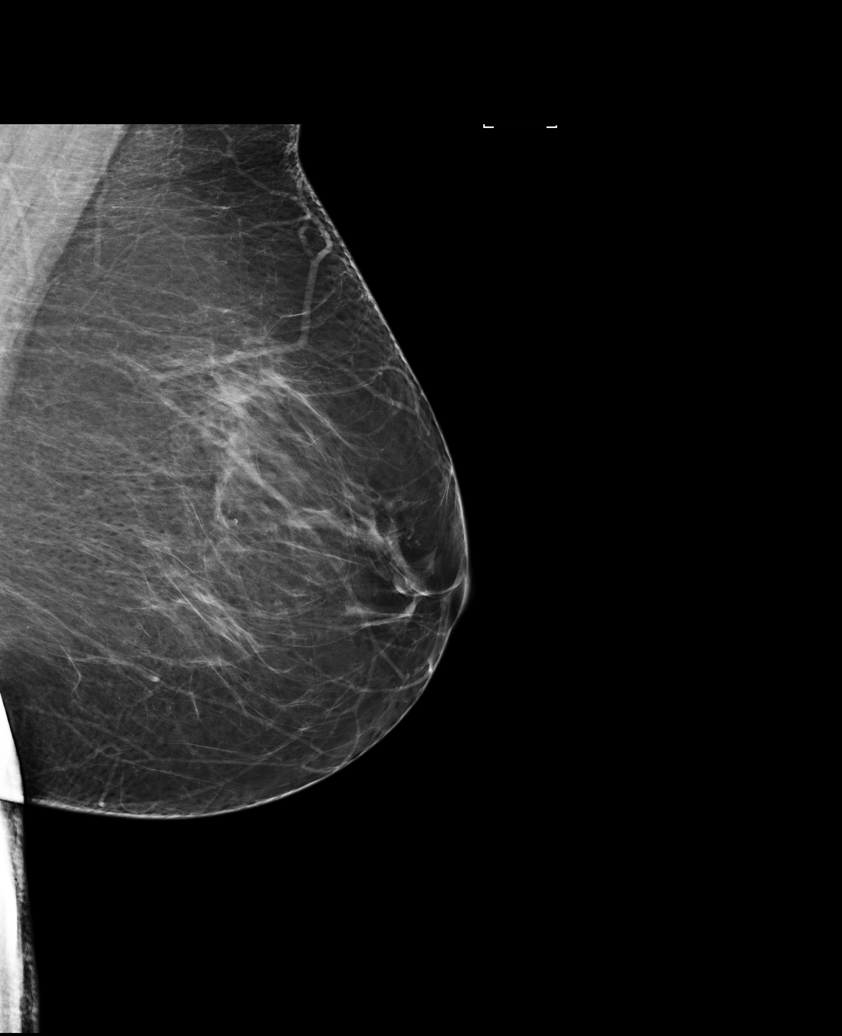

[R MLO]
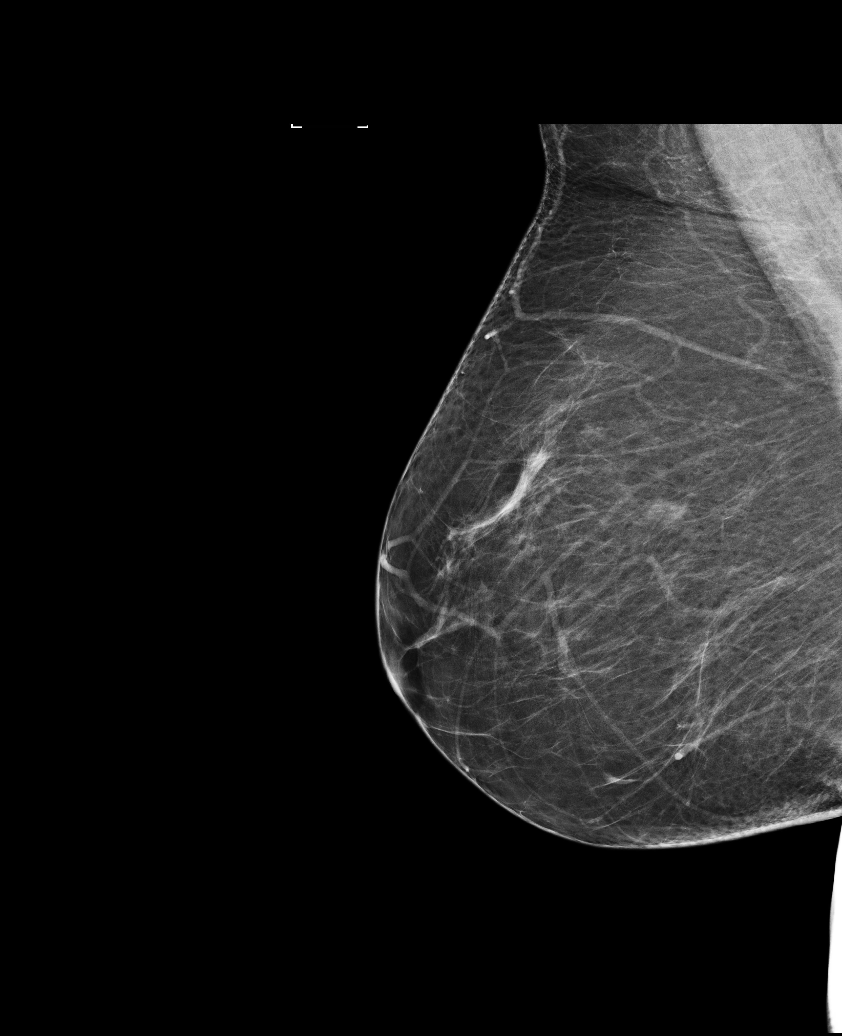

[L CC]
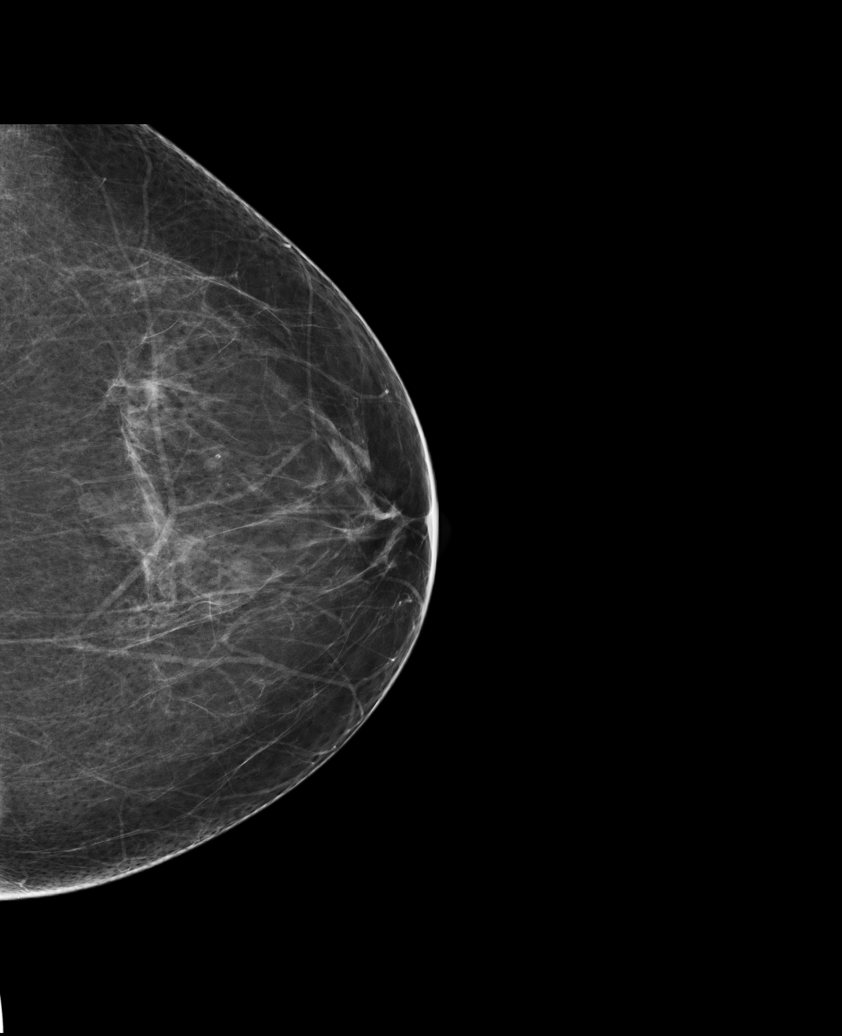

[R CC (2 of 2)]
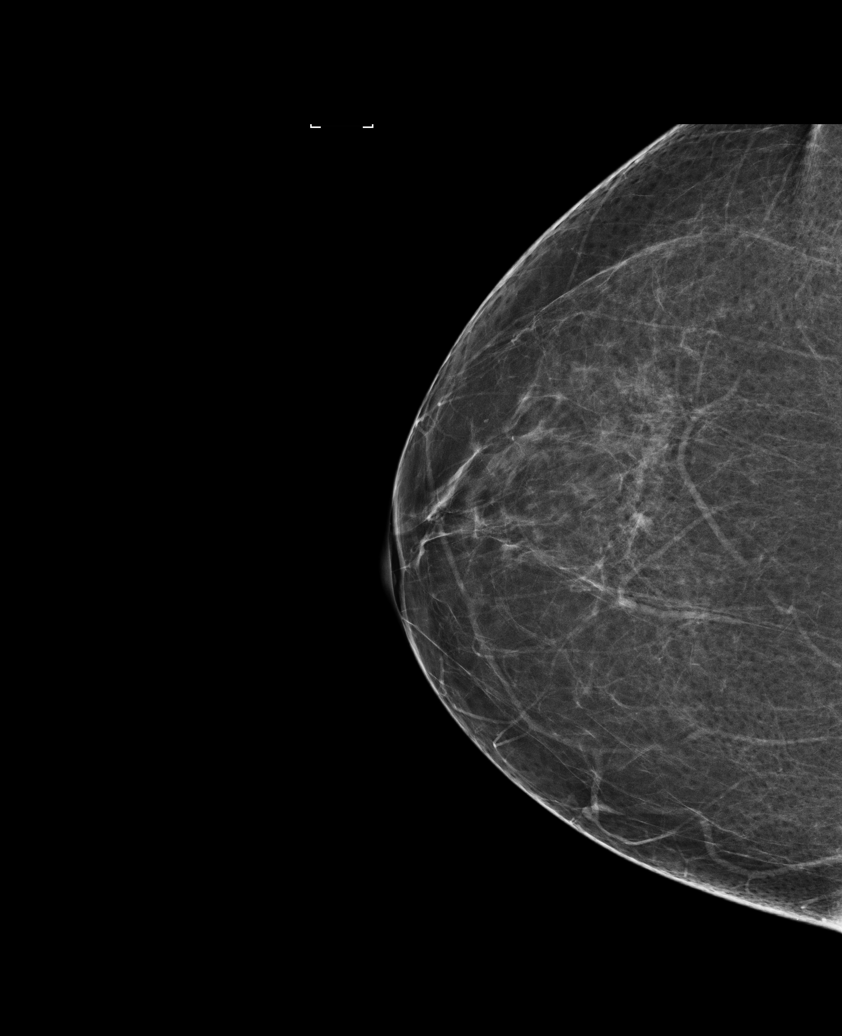

[5 of 5 positions shown; findings below may reference images not displayed]

ACR Breast Density Category b: There are scattered areas of
fibroglandular density.
FINDINGS: There are no findings suspicious for malignancy.
IMPRESSION: No mammographic evidence of malignancy. A result letter of this
screening mammogram will be mailed directly to the patient.

RECOMMENDATION:
Screening mammogram in one year. (Code:[id number]-V-ZRK)

BI-RADS CATEGORY  1: Negative.

## 2021-10-08 ENCOUNTER — Encounter: Payer: Self-pay | Admitting: Internal Medicine

## 2021-11-03 ENCOUNTER — Other Ambulatory Visit: Payer: Self-pay | Admitting: Physician Assistant

## 2021-11-03 DIAGNOSIS — Z1231 Encounter for screening mammogram for malignant neoplasm of breast: Secondary | ICD-10-CM

## 2021-11-05 ENCOUNTER — Ambulatory Visit
Admission: RE | Admit: 2021-11-05 | Discharge: 2021-11-05 | Disposition: A | Discharge status: Home / Self Care | Payer: Managed Care, Other (non HMO) | Source: Ambulatory Visit | Attending: Physician Assistant | Admitting: Physician Assistant

## 2021-11-05 DIAGNOSIS — Z1231 Encounter for screening mammogram for malignant neoplasm of breast: Secondary | ICD-10-CM

## 2021-11-11 ENCOUNTER — Encounter: Payer: Self-pay | Admitting: Internal Medicine

## 2021-12-16 ENCOUNTER — Encounter: Payer: Self-pay | Admitting: Internal Medicine

## 2022-01-11 IMAGING — CR DG CHEST 2V
2 series · 2 of 2 positions shown · non-contrast
Comparison: None.

CLINICAL DATA: Cough for 2-3 months.

EXAM:
CHEST - 2 VIEW

[w chest pa]
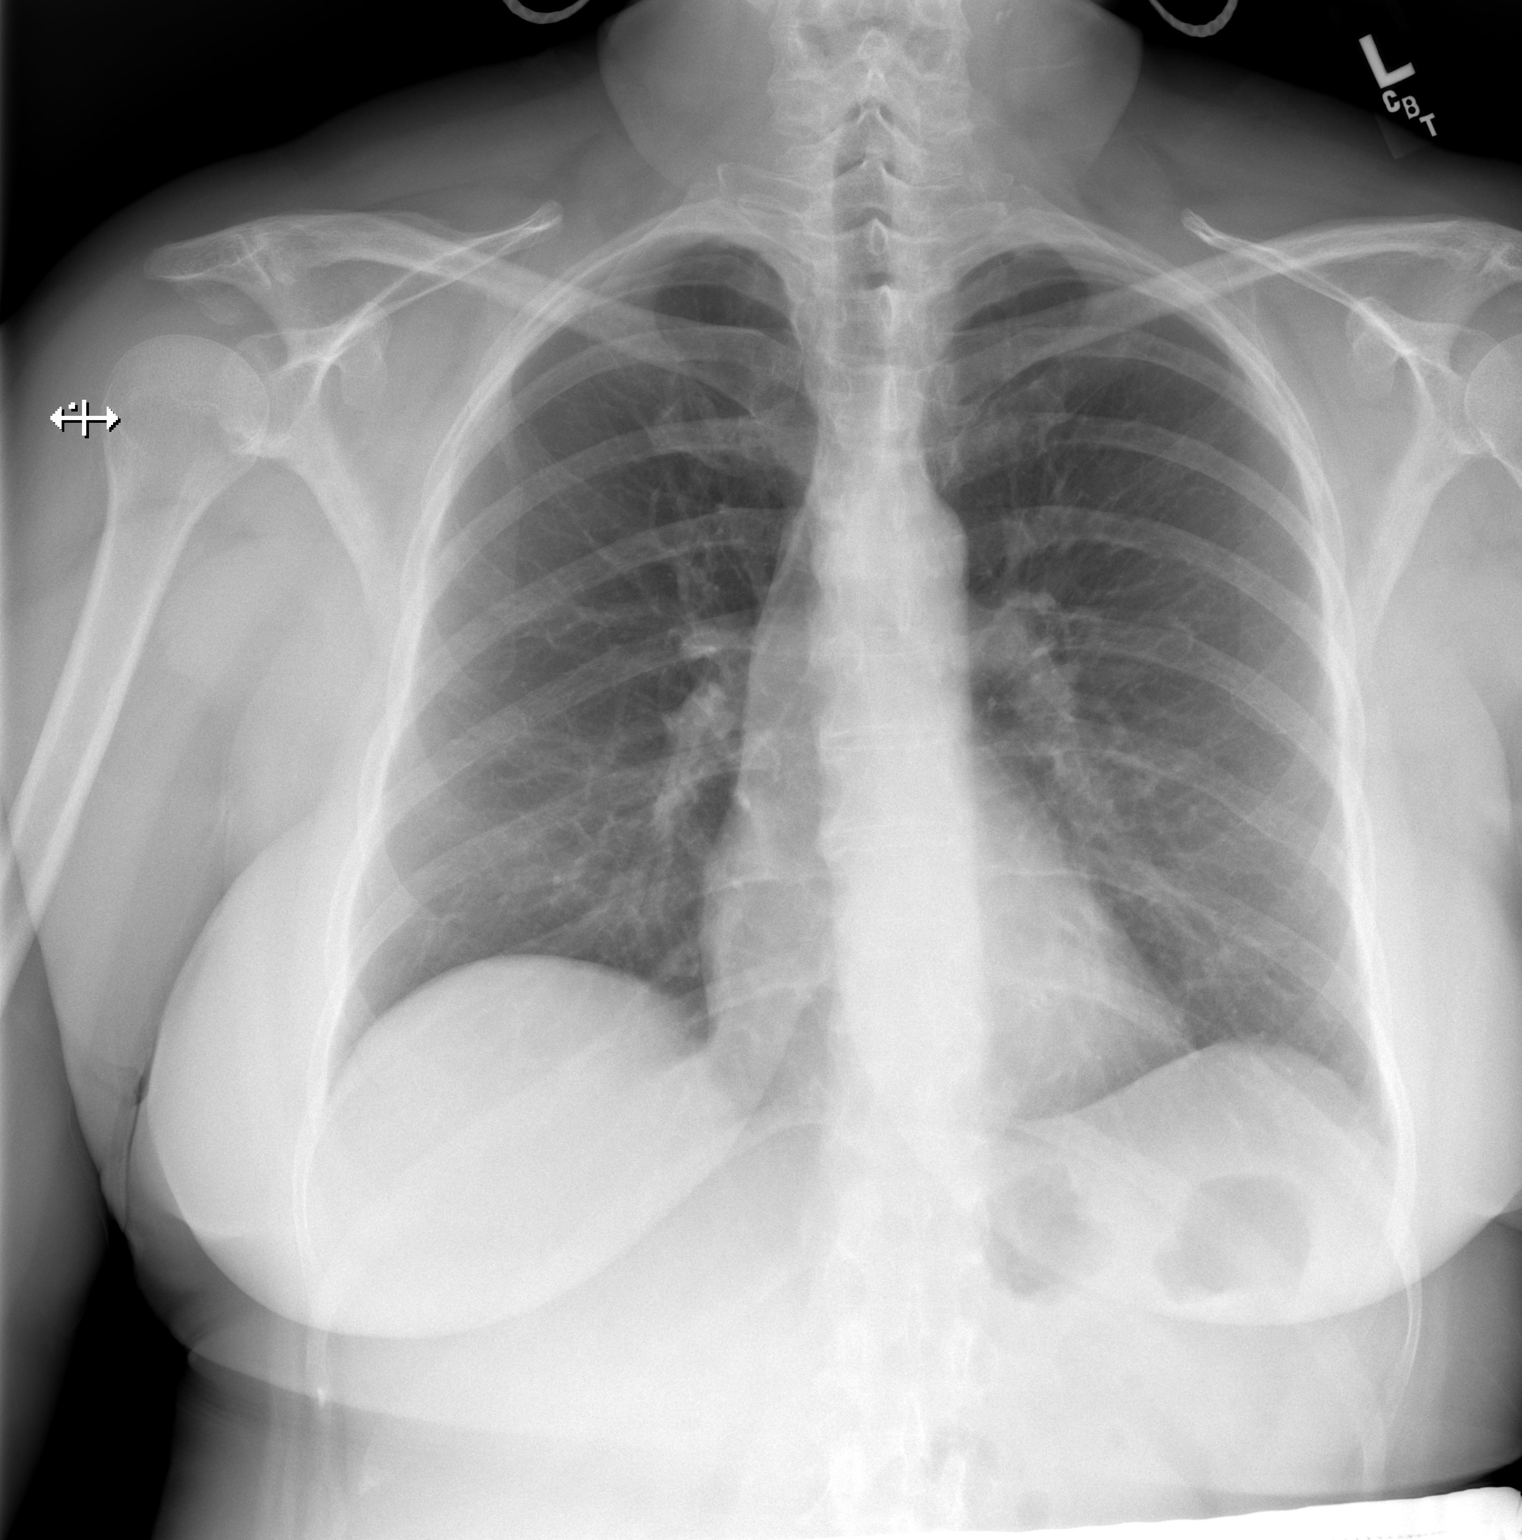

[w chest lat]
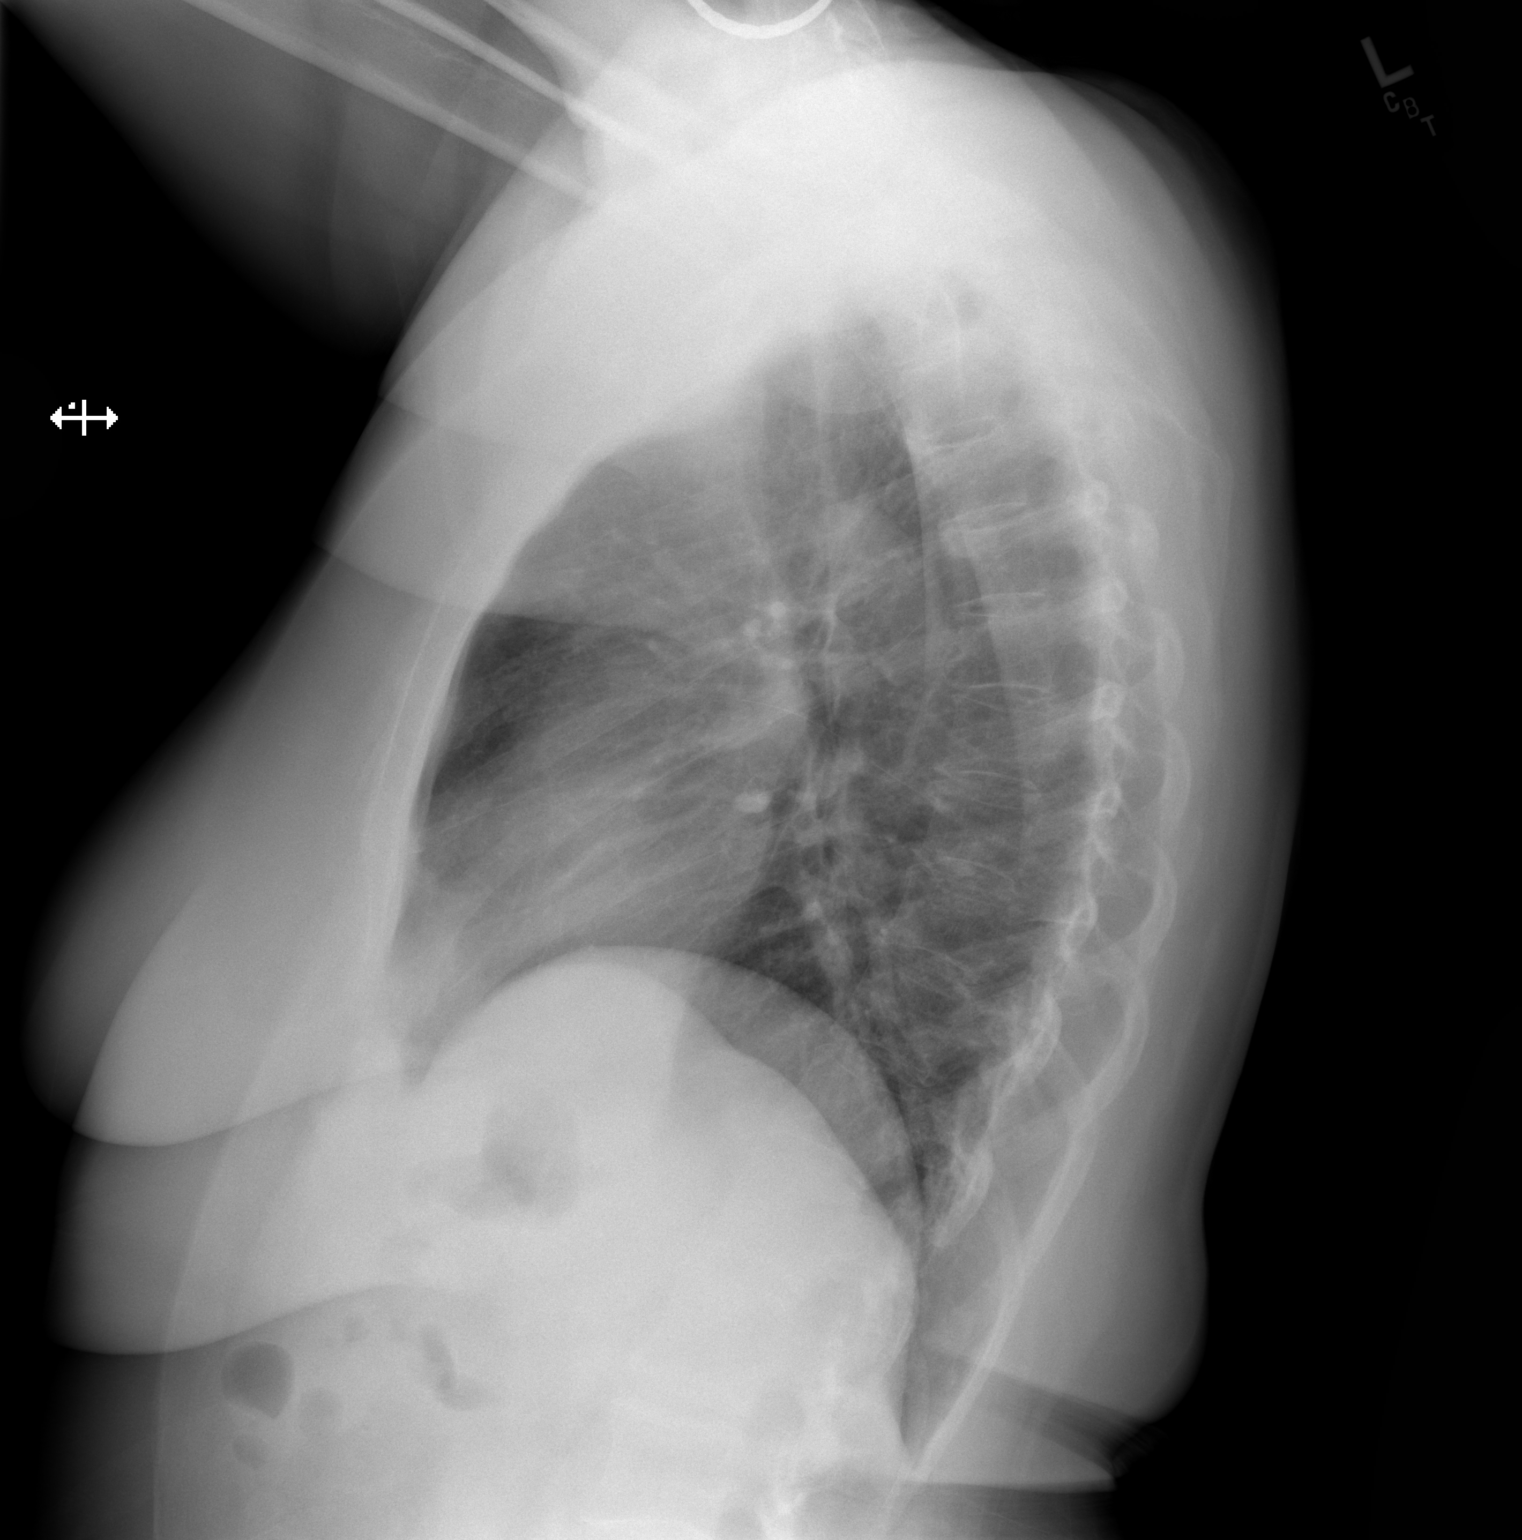

[2 of 2 positions shown; findings below may reference images not displayed]

FINDINGS: Normal heart size. Normal mediastinal contour. No pneumothorax. No
pleural effusion. Lungs appear clear, with no acute consolidative
airspace disease and no pulmonary edema.
IMPRESSION: No active cardiopulmonary disease.

## 2022-11-17 ENCOUNTER — Other Ambulatory Visit: Payer: Self-pay | Admitting: Internal Medicine

## 2022-11-17 DIAGNOSIS — Z1231 Encounter for screening mammogram for malignant neoplasm of breast: Secondary | ICD-10-CM

## 2022-12-09 ENCOUNTER — Ambulatory Visit: Payer: BC Managed Care – PPO

## 2022-12-09 ENCOUNTER — Ambulatory Visit
Admission: RE | Admit: 2022-12-09 | Discharge: 2022-12-09 | Disposition: A | Discharge status: Home / Self Care | Payer: BC Managed Care – PPO | Source: Ambulatory Visit | Attending: Internal Medicine | Admitting: Internal Medicine

## 2022-12-09 DIAGNOSIS — Z1231 Encounter for screening mammogram for malignant neoplasm of breast: Secondary | ICD-10-CM
